# Patient Record
Sex: Female | Born: 1995 | Race: Black or African American | Hispanic: Yes | Marital: Single | Smoking: Never smoker
Health system: Southern US, Community
[De-identification: ages and names within clinical notes are randomized; demographics above are authoritative.]

## PROBLEM LIST (undated history)

## (undated) DIAGNOSIS — D649 Anemia, unspecified: Secondary | ICD-10-CM

## (undated) DIAGNOSIS — Z789 Other specified health status: Secondary | ICD-10-CM

## (undated) HISTORY — DX: Anemia, unspecified: D64.9

## (undated) HISTORY — DX: Other specified health status: Z78.9

---
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
## 2017-03-19 ENCOUNTER — Other Ambulatory Visit: Payer: Self-pay | Admitting: Nurse Practitioner

## 2017-03-19 DIAGNOSIS — N63 Unspecified lump in unspecified breast: Secondary | ICD-10-CM

## 2017-04-01 ENCOUNTER — Other Ambulatory Visit: Payer: Self-pay

## 2017-05-30 ENCOUNTER — Encounter: Payer: Self-pay | Admitting: Emergency Medicine

## 2017-05-30 ENCOUNTER — Other Ambulatory Visit: Payer: Self-pay

## 2017-05-30 ENCOUNTER — Emergency Department
Admission: EM | Admit: 2017-05-30 | Discharge: 2017-05-30 | Disposition: A | Discharge status: Home / Self Care | Payer: Self-pay | Attending: Emergency Medicine | Admitting: Emergency Medicine

## 2017-05-30 DIAGNOSIS — Z5321 Procedure and treatment not carried out due to patient leaving prior to being seen by health care provider: Secondary | ICD-10-CM | POA: Insufficient documentation

## 2017-05-30 DIAGNOSIS — R102 Pelvic and perineal pain: Secondary | ICD-10-CM | POA: Insufficient documentation

## 2017-05-30 NOTE — ED Notes (Signed)
Called for room not in waiting room.

## 2017-05-30 NOTE — ED Notes (Signed)
Called for room, not in waiting room.  

## 2017-05-30 NOTE — ED Triage Notes (Signed)
Patient reports increased vaginal bleeding and pelvic pain since having IUD placed in January.  Patient reports she is unable to locate string now.

## 2017-11-12 LAB — HM HIV SCREENING LAB: HM HIV Screening: NEGATIVE

## 2018-01-19 ENCOUNTER — Encounter: Payer: Self-pay | Admitting: Obstetrics and Gynecology

## 2018-03-09 ENCOUNTER — Encounter: Payer: Self-pay | Admitting: Emergency Medicine

## 2018-03-09 ENCOUNTER — Emergency Department
Admission: EM | Admit: 2018-03-09 | Discharge: 2018-03-10 | Disposition: A | Discharge status: Home / Self Care | Payer: Medicaid Other | Attending: Emergency Medicine | Admitting: Emergency Medicine

## 2018-03-09 ENCOUNTER — Emergency Department: Payer: Medicaid Other

## 2018-03-09 ENCOUNTER — Other Ambulatory Visit: Payer: Self-pay

## 2018-03-09 DIAGNOSIS — O209 Hemorrhage in early pregnancy, unspecified: Secondary | ICD-10-CM | POA: Insufficient documentation

## 2018-03-09 DIAGNOSIS — Z3A00 Weeks of gestation of pregnancy not specified: Secondary | ICD-10-CM | POA: Insufficient documentation

## 2018-03-09 DIAGNOSIS — O4690 Antepartum hemorrhage, unspecified, unspecified trimester: Secondary | ICD-10-CM

## 2018-03-09 DIAGNOSIS — Z79899 Other long term (current) drug therapy: Secondary | ICD-10-CM | POA: Insufficient documentation

## 2018-03-09 DIAGNOSIS — F1210 Cannabis abuse, uncomplicated: Secondary | ICD-10-CM | POA: Diagnosis not present

## 2018-03-09 DIAGNOSIS — Z7689 Persons encountering health services in other specified circumstances: Secondary | ICD-10-CM | POA: Diagnosis not present

## 2018-03-09 DIAGNOSIS — Z0379 Encounter for other suspected maternal and fetal conditions ruled out: Secondary | ICD-10-CM | POA: Diagnosis not present

## 2018-03-09 DIAGNOSIS — Z8759 Personal history of other complications of pregnancy, childbirth and the puerperium: Secondary | ICD-10-CM | POA: Insufficient documentation

## 2018-03-09 DIAGNOSIS — A599 Trichomoniasis, unspecified: Secondary | ICD-10-CM | POA: Insufficient documentation

## 2018-03-09 LAB — WET PREP, GENITAL
Clue Cells Wet Prep HPF POC: NONE SEEN
Sperm: NONE SEEN
Yeast Wet Prep HPF POC: NONE SEEN

## 2018-03-09 LAB — HCG, QUANTITATIVE, PREGNANCY: hCG, Beta Chain, Quant, S: 44440 m[IU]/mL — ABNORMAL HIGH (ref <5)

## 2018-03-09 LAB — CHLAMYDIA/NGC RT PCR (ARMC ONLY)
CHLAMYDIA TR: NOT DETECTED
N gonorrhoeae: NOT DETECTED

## 2018-03-09 MED ORDER — METRONIDAZOLE 500 MG PO TABS: 500.0000 mg | ORAL_TABLET | Freq: Two times a day (BID) | ORAL | 0 refills | Status: AC

## 2018-03-09 NOTE — ED Notes (Signed)
Patient transported to Ultrasound 

## 2018-03-09 NOTE — ED Triage Notes (Signed)
Pt presents to ED via POV with c/o vaginal discharge. Pt states is pregnant, unknown how far along, and has not had prenatal visit yet. Pt states brown discharge, with intermittent small spotting that started yesterday.

## 2018-03-09 NOTE — ED Provider Notes (Signed)
[facility] Emergency Department Provider Note  ____________________________________________   I have reviewed the triage vital signs and the nursing notes.   HISTORY  Chief Complaint Vaginal Bleeding   History limited by: Not Limited   HPI Loretta Bird is a 22 y.o. female who presents to the emergency department today is because of concerns for vaginal spotting in the setting of early pregnancy.  The patient states she found out she was pregnant a couple of weeks ago.  Since then she has noticed increased vaginal discharge.  She denies any bad odor to the discharge.  She states that she today noticed some spotting.  She denies any associated abdominal pain.  She states that she does have history of miscarriage in the past.    Per medical record review patient has a history of miscarraige  History reviewed. No pertinent past medical history.  History reviewed. No pertinent surgical history.  Prior to Admission medications   Medication Sig Start Date End Date Taking? Authorizing Provider  ibuprofen (ADVIL,MOTRIN) 600 MG tablet Take 1 tablet (600 mg total) by mouth every 6 (six) hours as needed. 11/02/16   Minna AntisPaduchowski, Kevin, MD  Prenatal Vit-Fe Fumarate-FA (PRENATAL MULTIVITAMIN) TABS tablet Take 1 tablet by mouth daily at 12 noon.    [provider]    Allergies Patient has no known allergies.  No family history on file.  Social History Social History   Tobacco Use  . Smoking status: Never Smoker  . Smokeless tobacco: Never Used  Substance Use Topics  . Alcohol use: No  . Drug use: Yes    Types: Marijuana    Comment: Last use prior to pregnancy    Review of Systems Constitutional: No fever/chills Eyes: No visual changes. ENT: No sore throat. Cardiovascular: Denies chest pain. Respiratory: Denies shortness of breath. Gastrointestinal: No abdominal pain.  No nausea, no vomiting.  No diarrhea.   Genitourinary: Positive for  vaginal spotting and discharge.  Musculoskeletal: Negative for back pain. Skin: Negative for rash. Neurological: Negative for headaches, focal weakness or numbness.  ____________________________________________   PHYSICAL EXAM:  VITAL SIGNS: ED Triage Vitals  Enc Vitals Group     BP 03/09/18 1736 103/64     Pulse Rate 03/09/18 1736 (!) 103     Resp 03/09/18 1736 18     Temp 03/09/18 1736 98.9 F (37.2 C)     Temp Source 03/09/18 1736 Oral     SpO2 03/09/18 1736 100 %     Weight 03/09/18 1737 130 lb (59 kg)     Height 03/09/18 1737 5\' 3"  (1.6 m)     Head Circumference --      Peak Flow --      Pain Score 03/09/18 1736 1   Constitutional: Alert and oriented.  Eyes: Conjunctivae are normal.  ENT      Head: Normocephalic and atraumatic.      Nose: No congestion/rhinnorhea.      Mouth/Throat: Mucous membranes are moist.      Neck: No stridor. Hematological/Lymphatic/Immunilogical: No cervical lymphadenopathy. Cardiovascular: Normal rate, regular rhythm.  No murmurs, rubs, or gallops. Respiratory: Normal respiratory effort without tachypnea nor retractions. Breath sounds are clear and equal bilaterally. No wheezes/rales/rhonchi. Gastrointestinal: Soft and non tender. No rebound. No guarding.  Genitourinary: Deferred Musculoskeletal: Normal range of motion in all extremities. No lower extremity edema. Neurologic:  Normal speech and language. No gross focal neurologic deficits are appreciated.  Skin:  Skin is warm, dry and intact. No rash noted.  Psychiatric: Mood and affect are normal. Speech and behavior are normal. Patient exhibits appropriate insight and judgment.  ____________________________________________    LABS (pertinent positives/negatives)  Wet prep positive trich hcg [id number]  ____________________________________________   EKG  None  ____________________________________________    RADIOLOGY  [place]  pending  ____________________________________________   PROCEDURES  Procedures  ____________________________________________   INITIAL IMPRESSION / ASSESSMENT AND PLAN / ED COURSE  Pertinent labs & imaging results that were available during my care of the patient were reviewed by me and considered in my medical decision making (see chart for details).   Patient presented to the emergency department today because of concerns for vaginal spotting in setting of early pregnancy.  Patient was also complaining of vaginal discharge so pelvic exam and wet prep were performed.  This was positive for trichomoniasis.  Did discuss this with the patient.  Discussed importance of sexual partners being treated.  Unfortunately bedside ultrasound was not able to identify a heartbeat although I did find an intrauterine pregnancy.  Will send patient for formal ultrasound to better assess. Patient had previous abo performed showing O pos.   ____________________________________________   FINAL CLINICAL IMPRESSION(S) / ED DIAGNOSES  Final diagnoses:  Trichimoniasis  Vaginal bleeding in pregnancy   Note: This dictation was prepared with Dragon dictation. Any transcriptional errors that result from this process are unintentional     Phineas Semen, MD 03/09/18 2252

## 2018-03-10 MED ORDER — METRONIDAZOLE 500 MG PO TABS
ORAL_TABLET | ORAL | Status: AC
  Administered 2018-03-10: 2000 mg via ORAL
  Filled 2018-03-10: qty 4

## 2018-03-10 MED ORDER — METRONIDAZOLE 500 MG PO TABS
2000.0000 mg | ORAL_TABLET | Freq: Once | ORAL | Status: AC
  Administered 2018-03-10: 2000 mg via ORAL

## 2018-03-10 NOTE — ED Notes (Signed)
This Clinical research associatewriter went to check on patient. Patient was not in room. Notified EDP Dr. Manson PasseyBrown. This Clinical research associatewriter checked bathrooms and patient was not in either. ED tech from front walked patient back to ed after patient was trying to leave AMA. EDP spoke to patient with this Clinical research associatewriter and another Charity fundraiserN as witnesses.

## 2018-03-10 NOTE — ED Notes (Signed)
EDP in with patient 

## 2018-03-10 NOTE — ED Provider Notes (Signed)
I assumed care of the patient from Dr. Derrill KayGoodman at 11:30 PM with ultrasound pending.  Ultrasound revealed a single intrauterine pregnancy at 11 weeks 5 days with absent fetal activity consistent with fetal demise per radiologist.  I spoke with the patient regarding ultrasound findings and subsequently spoke with Dr. Logan BoresEvans [facility] regarding ultrasound findings who will see the patient on the outpatient setting.  Patient also noted to have trichomonas for which patient was treated.  Patient was informed to follow-up with Dr. Logan BoresEvans today.   Darci CurrentBrown, [place], MD 03/10/18 [phone]

## 2018-03-11 LAB — RPR: RPR Ser Ql: NONREACTIVE

## 2018-03-11 LAB — HIV ANTIBODY (ROUTINE TESTING W REFLEX): HIV Screen 4th Generation wRfx: NONREACTIVE

## 2018-03-22 ENCOUNTER — Ambulatory Visit (INDEPENDENT_AMBULATORY_CARE_PROVIDER_SITE_OTHER): Payer: Medicaid Other | Admitting: Obstetrics and Gynecology

## 2018-03-22 ENCOUNTER — Encounter: Payer: Medicaid Other | Admitting: Obstetrics and Gynecology

## 2018-03-22 ENCOUNTER — Encounter: Payer: Self-pay | Admitting: Obstetrics and Gynecology

## 2018-03-22 VITALS — BP 94/64 | HR 96 | Ht 63.0 in | Wt 133.3 lb

## 2018-03-22 DIAGNOSIS — A599 Trichomoniasis, unspecified: Secondary | ICD-10-CM

## 2018-03-22 DIAGNOSIS — O021 Missed abortion: Secondary | ICD-10-CM | POA: Diagnosis not present

## 2018-03-22 DIAGNOSIS — Z01818 Encounter for other preprocedural examination: Secondary | ICD-10-CM | POA: Diagnosis not present

## 2018-03-22 MED ORDER — METRONIDAZOLE 500 MG PO TABS: ORAL_TABLET | ORAL | 0 refills | Status: DC

## 2018-03-22 NOTE — H&P (View-Only) (Signed)
PRE-OPERATIVE HISTORY AND PHYSICAL EXAM  PCP:  Patient, No Pcp Per Subjective:   HPI:  Loretta Bird is a 22 y.o. G3P0010.  No LMP recorded (lmp unknown). Patient is pregnant.  She presents today for a pre-op discussion and PE.  She has the following symptoms: Patient seen in the ED and found to have a missed AB.  This is her first follow-up.  She reports a small amount of spotting but no cramping or bleeding.  She has previously had a miscarriage and says it was "terrible".  She does not want to have another miscarriage and would prefer surgery if possible.  Patient took Flagyl for trichomonas but says she threw up the pills within a few minutes.  She reports her partner has been treated.  Review of Systems:   Constitutional: Denied constitutional symptoms, night sweats, recent illness, fatigue, fever, insomnia and weight loss.  Eyes: Denied eye symptoms, eye pain, photophobia, vision change and visual disturbance.  Ears/Nose/Throat/Neck: Denied ear, nose, throat or neck symptoms, hearing loss, nasal discharge, sinus congestion and sore throat.  Cardiovascular: Denied cardiovascular symptoms, arrhythmia, chest pain/pressure, edema, exercise intolerance, orthopnea and palpitations.  Respiratory: Denied pulmonary symptoms, asthma, pleuritic pain, productive sputum, cough, dyspnea and wheezing.  Gastrointestinal: Denied, gastro-esophageal reflux, melena, nausea and vomiting.  Genitourinary: See HPI for additional information.  Musculoskeletal: Denied musculoskeletal symptoms, stiffness, swelling, muscle weakness and myalgia.  Dermatologic: Denied dermatology symptoms, rash and scar.  Neurologic: Denied neurology symptoms, dizziness, headache, neck pain and syncope.  Psychiatric: Denied psychiatric symptoms, anxiety and depression.  Endocrine: Denied endocrine symptoms including hot flashes and night sweats.   OB History  Gravida Para Term Preterm AB Living  3       1    SAB TAB  Ectopic Multiple Live Births               # Outcome Date GA Lbr Len/2nd Weight Sex Delivery Anes PTL Lv  3 Current           2 AB 2017          1 Gravida             No past medical history on file.  No past surgical history on file.    SOCIAL HISTORY: Social History   Tobacco Use  Smoking Status Never Smoker  Smokeless Tobacco Never Used   Social History   Substance and Sexual Activity  Alcohol Use No   Social History   Substance and Sexual Activity  Drug Use Yes  . Types: Marijuana   Comment: Last use prior to pregnancy    No family history on file.  ALLERGIES:  Patient has no known allergies.  MEDS:   Current Outpatient Medications on File Prior to Visit  Medication Sig Dispense Refill  . Prenatal Vit-Fe Fumarate-FA (PRENATAL MULTIVITAMIN) TABS tablet Take 1 tablet by mouth daily at 12 noon.     No current facility-administered medications on file prior to visit.     No orders of the defined types were placed in this encounter.    Physical examination BP 94/64   Pulse 96   Ht 5\' 3"  (1.6 m)   Wt 133 lb 4.8 oz (60.5 kg)   LMP  (LMP Unknown)   BMI 23.61 kg/m   General NAD, Conversant  HEENT Atraumatic; Op clear with mmm.  Normo-cephalic. Pupils reactive. Anicteric sclerae  Thyroid/Neck Smooth without nodularity or enlargement. Normal ROM.  Neck Supple.  Skin No  rashes, lesions or ulceration. Normal palpated skin turgor. No nodularity.  Breasts: No masses or discharge.  Symmetric.  No axillary adenopathy.  Lungs: Clear to auscultation.No rales or wheezes. Normal Respiratory effort, no retractions.  Heart: NSR.  No murmurs or rubs appreciated. No periferal edema  Abdomen: Soft.  Non-tender.  No masses.  No HSM. No hernia  Extremities: Moves all appropriately.  Normal ROM for age. No lymphadenopathy.  Neuro: Oriented to PPT.  Normal mood. Normal affect.     Pelvic:   Vulva: Normal appearance.  No lesions.  Vagina: No lesions or abnormalities noted.   Support: Normal pelvic support.  Urethra No masses tenderness or scarring.  Meatus Normal size without lesions or prolapse.  Cervix: Normal ectropion.  No lesions.  Anus: Normal exam.  No lesions.  Perineum: Normal exam.  No lesions.        Bimanual   Uterus: 10 wks  Non-tender.  Mobile.  AV.  Adnexae: No masses.  Non-tender to palpation.  Cul-de-sac: Negative for abnormality.   Assessment:   G3P0010 Patient Active Problem List   Diagnosis Date Noted  . ERRONEOUS ENCOUNTER--DISREGARD 02/28/2015    1. Preop examination   2. Missed ab    Patient desires surgery rather than expectant management for medical management of miscarriage.  Plan:   Orders: No orders of the defined types were placed in this encounter.    1.  D&E 2.  Will retreat for trichomonas using Flagyl.  Pre-op discussions regarding Risks and Benefits of her scheduled surgery.  D&E The procedure and the risks and benefits of dilation and curettage/evacuation have been explained to the patient.  The specific risks of bleeding, infection, anesthesia, uterine perforation, and damage to bowel or bladder  have been specifically discussed.  I have answered all of her questions and I believe that she has an adequate and informed understanding of this procedure.  I spent 33 minutes involved in the care of this patient of which greater than 50% was spent discussing miscarriage, missed AB, options of expectant management, medical management, and surgical management.  Risks and benefits of each were reviewed.  All questions answered.  Treatment of trichomonas discussed.  Elonda Huskyavid J. Evans, M.D. 03/22/2018 3:17 PM

## 2018-03-22 NOTE — Progress Notes (Signed)
PRE-OPERATIVE HISTORY AND PHYSICAL EXAM  PCP:  Patient, No Pcp Per Subjective:   HPI:  Loretta Bird is a 22 y.o. G3P0010.  No LMP recorded (lmp unknown). Patient is pregnant.  She presents today for a pre-op discussion and PE.  She has the following symptoms: Patient seen in the ED and found to have a missed AB.  This is her first follow-up.  She reports a small amount of spotting but no cramping or bleeding.  She has previously had a miscarriage and says it was "terrible".  She does not want to have another miscarriage and would prefer surgery if possible.  Patient took Flagyl for trichomonas but says she threw up the pills within a few minutes.  She reports her partner has been treated.  Review of Systems:   Constitutional: Denied constitutional symptoms, night sweats, recent illness, fatigue, fever, insomnia and weight loss.  Eyes: Denied eye symptoms, eye pain, photophobia, vision change and visual disturbance.  Ears/Nose/Throat/Neck: Denied ear, nose, throat or neck symptoms, hearing loss, nasal discharge, sinus congestion and sore throat.  Cardiovascular: Denied cardiovascular symptoms, arrhythmia, chest pain/pressure, edema, exercise intolerance, orthopnea and palpitations.  Respiratory: Denied pulmonary symptoms, asthma, pleuritic pain, productive sputum, cough, dyspnea and wheezing.  Gastrointestinal: Denied, gastro-esophageal reflux, melena, nausea and vomiting.  Genitourinary: See HPI for additional information.  Musculoskeletal: Denied musculoskeletal symptoms, stiffness, swelling, muscle weakness and myalgia.  Dermatologic: Denied dermatology symptoms, rash and scar.  Neurologic: Denied neurology symptoms, dizziness, headache, neck pain and syncope.  Psychiatric: Denied psychiatric symptoms, anxiety and depression.  Endocrine: Denied endocrine symptoms including hot flashes and night sweats.   OB History  Gravida Para Term Preterm AB Living  3       1    SAB TAB  Ectopic Multiple Live Births               # Outcome Date GA Lbr Len/2nd Weight Sex Delivery Anes PTL Lv  3 Current           2 AB 2017          1 Gravida             No past medical history on file.  No past surgical history on file.    SOCIAL HISTORY: Social History   Tobacco Use  Smoking Status Never Smoker  Smokeless Tobacco Never Used   Social History   Substance and Sexual Activity  Alcohol Use No   Social History   Substance and Sexual Activity  Drug Use Yes  . Types: Marijuana   Comment: Last use prior to pregnancy    No family history on file.  ALLERGIES:  Patient has no known allergies.  MEDS:   Current Outpatient Medications on File Prior to Visit  Medication Sig Dispense Refill  . Prenatal Vit-Fe Fumarate-FA (PRENATAL MULTIVITAMIN) TABS tablet Take 1 tablet by mouth daily at 12 noon.     No current facility-administered medications on file prior to visit.     No orders of the defined types were placed in this encounter.    Physical examination BP 94/64   Pulse 96   Ht 5\' 3"  (1.6 m)   Wt 133 lb 4.8 oz (60.5 kg)   LMP  (LMP Unknown)   BMI 23.61 kg/m   General NAD, Conversant  HEENT Atraumatic; Op clear with mmm.  Normo-cephalic. Pupils reactive. Anicteric sclerae  Thyroid/Neck Smooth without nodularity or enlargement. Normal ROM.  Neck Supple.  Skin No  rashes, lesions or ulceration. Normal palpated skin turgor. No nodularity.  Breasts: No masses or discharge.  Symmetric.  No axillary adenopathy.  Lungs: Clear to auscultation.No rales or wheezes. Normal Respiratory effort, no retractions.  Heart: NSR.  No murmurs or rubs appreciated. No periferal edema  Abdomen: Soft.  Non-tender.  No masses.  No HSM. No hernia  Extremities: Moves all appropriately.  Normal ROM for age. No lymphadenopathy.  Neuro: Oriented to PPT.  Normal mood. Normal affect.     Pelvic:   Vulva: Normal appearance.  No lesions.  Vagina: No lesions or abnormalities noted.   Support: Normal pelvic support.  Urethra No masses tenderness or scarring.  Meatus Normal size without lesions or prolapse.  Cervix: Normal ectropion.  No lesions.  Anus: Normal exam.  No lesions.  Perineum: Normal exam.  No lesions.        Bimanual   Uterus: 10 wks  Non-tender.  Mobile.  AV.  Adnexae: No masses.  Non-tender to palpation.  Cul-de-sac: Negative for abnormality.   Assessment:   G3P0010 Patient Active Problem List   Diagnosis Date Noted  . ERRONEOUS ENCOUNTER--DISREGARD 02/28/2015    1. Preop examination   2. Missed ab    Patient desires surgery rather than expectant management for medical management of miscarriage.  Plan:   Orders: No orders of the defined types were placed in this encounter.    1.  D&E 2.  Will retreat for trichomonas using Flagyl.  Pre-op discussions regarding Risks and Benefits of her scheduled surgery.  D&E The procedure and the risks and benefits of dilation and curettage/evacuation have been explained to the patient.  The specific risks of bleeding, infection, anesthesia, uterine perforation, and damage to bowel or bladder  have been specifically discussed.  I have answered all of her questions and I believe that she has an adequate and informed understanding of this procedure.  I spent 33 minutes involved in the care of this patient of which greater than 50% was spent discussing miscarriage, missed AB, options of expectant management, medical management, and surgical management.  Risks and benefits of each were reviewed.  All questions answered.  Treatment of trichomonas discussed.  Domenica Weightman J. Glendell Schlottman, M.D. 03/22/2018 3:17 PM   

## 2018-03-22 NOTE — H&P (Signed)
PRE-OPERATIVE HISTORY AND PHYSICAL EXAM  PCP:  Patient, No Pcp Per Subjective:   HPI:  Loretta Bird is a 22 y.o. G3P0010.  No LMP recorded (lmp unknown). Patient is pregnant.  She presents today for a pre-op discussion and PE.  She has the following symptoms: Patient seen in the ED and found to have a missed AB.  This is her first follow-up.  She reports a small amount of spotting but no cramping or bleeding.  She has previously had a miscarriage and says it was "terrible".  She does not want to have another miscarriage and would prefer surgery if possible.  Patient took Flagyl for trichomonas but says she threw up the pills within a few minutes.  She reports her partner has been treated.  Review of Systems:   Constitutional: Denied constitutional symptoms, night sweats, recent illness, fatigue, fever, insomnia and weight loss.  Eyes: Denied eye symptoms, eye pain, photophobia, vision change and visual disturbance.  Ears/Nose/Throat/Neck: Denied ear, nose, throat or neck symptoms, hearing loss, nasal discharge, sinus congestion and sore throat.  Cardiovascular: Denied cardiovascular symptoms, arrhythmia, chest pain/pressure, edema, exercise intolerance, orthopnea and palpitations.  Respiratory: Denied pulmonary symptoms, asthma, pleuritic pain, productive sputum, cough, dyspnea and wheezing.  Gastrointestinal: Denied, gastro-esophageal reflux, melena, nausea and vomiting.  Genitourinary: See HPI for additional information.  Musculoskeletal: Denied musculoskeletal symptoms, stiffness, swelling, muscle weakness and myalgia.  Dermatologic: Denied dermatology symptoms, rash and scar.  Neurologic: Denied neurology symptoms, dizziness, headache, neck pain and syncope.  Psychiatric: Denied psychiatric symptoms, anxiety and depression.  Endocrine: Denied endocrine symptoms including hot flashes and night sweats.   OB History  Gravida Para Term Preterm AB Living  3       1    SAB TAB  Ectopic Multiple Live Births               # Outcome Date GA Lbr Len/2nd Weight Sex Delivery Anes PTL Lv  3 Current           2 AB 2017          1 Gravida             No past medical history on file.  No past surgical history on file.    SOCIAL HISTORY: Social History   Tobacco Use  Smoking Status Never Smoker  Smokeless Tobacco Never Used   Social History   Substance and Sexual Activity  Alcohol Use No   Social History   Substance and Sexual Activity  Drug Use Yes  . Types: Marijuana   Comment: Last use prior to pregnancy    No family history on file.  ALLERGIES:  Patient has no known allergies.  MEDS:   Current Outpatient Medications on File Prior to Visit  Medication Sig Dispense Refill  . Prenatal Vit-Fe Fumarate-FA (PRENATAL MULTIVITAMIN) TABS tablet Take 1 tablet by mouth daily at 12 noon.     No current facility-administered medications on file prior to visit.     No orders of the defined types were placed in this encounter.    Physical examination BP 94/64   Pulse 96   Ht 5\' 3"  (1.6 m)   Wt 133 lb 4.8 oz (60.5 kg)   LMP  (LMP Unknown)   BMI 23.61 kg/m   General NAD, Conversant  HEENT Atraumatic; Op clear with mmm.  Normo-cephalic. Pupils reactive. Anicteric sclerae  Thyroid/Neck Smooth without nodularity or enlargement. Normal ROM.  Neck Supple.  Skin No  rashes, lesions or ulceration. Normal palpated skin turgor. No nodularity.  Breasts: No masses or discharge.  Symmetric.  No axillary adenopathy.  Lungs: Clear to auscultation.No rales or wheezes. Normal Respiratory effort, no retractions.  Heart: NSR.  No murmurs or rubs appreciated. No periferal edema  Abdomen: Soft.  Non-tender.  No masses.  No HSM. No hernia  Extremities: Moves all appropriately.  Normal ROM for age. No lymphadenopathy.  Neuro: Oriented to PPT.  Normal mood. Normal affect.     Pelvic:   Vulva: Normal appearance.  No lesions.  Vagina: No lesions or abnormalities noted.   Support: Normal pelvic support.  Urethra No masses tenderness or scarring.  Meatus Normal size without lesions or prolapse.  Cervix: Normal ectropion.  No lesions.  Anus: Normal exam.  No lesions.  Perineum: Normal exam.  No lesions.        Bimanual   Uterus: 10 wks  Non-tender.  Mobile.  AV.  Adnexae: No masses.  Non-tender to palpation.  Cul-de-sac: Negative for abnormality.   Assessment:   G3P0010 Patient Active Problem List   Diagnosis Date Noted  . ERRONEOUS ENCOUNTER--DISREGARD 02/28/2015    1. Preop examination   2. Missed ab    Patient desires surgery rather than expectant management for medical management of miscarriage.  Plan:   Orders: No orders of the defined types were placed in this encounter.    1.  D&E 2.  Will retreat for trichomonas using Flagyl.  Pre-op discussions regarding Risks and Benefits of her scheduled surgery.  D&E The procedure and the risks and benefits of dilation and curettage/evacuation have been explained to the patient.  The specific risks of bleeding, infection, anesthesia, uterine perforation, and damage to bowel or bladder  have been specifically discussed.  I have answered all of her questions and I believe that she has an adequate and informed understanding of this procedure.  I spent 33 minutes involved in the care of this patient of which greater than 50% was spent discussing miscarriage, missed AB, options of expectant management, medical management, and surgical management.  Risks and benefits of each were reviewed.  All questions answered.  Treatment of trichomonas discussed.   J. , M.D. 03/22/2018 3:17 PM   

## 2018-03-26 ENCOUNTER — Ambulatory Visit: Payer: Medicaid Other | Admitting: Anesthesiology

## 2018-03-26 ENCOUNTER — Ambulatory Visit
Admission: RE | Admit: 2018-03-26 | Discharge: 2018-03-26 | Disposition: A | Discharge status: Home / Self Care | Payer: Medicaid Other | Attending: Obstetrics and Gynecology | Admitting: Obstetrics and Gynecology

## 2018-03-26 ENCOUNTER — Encounter
Admission: RE | Disposition: A | Discharge status: Home / Self Care | Payer: Self-pay | Source: Home / Self Care | Attending: Obstetrics and Gynecology

## 2018-03-26 ENCOUNTER — Other Ambulatory Visit: Payer: Self-pay

## 2018-03-26 DIAGNOSIS — O021 Missed abortion: Secondary | ICD-10-CM | POA: Insufficient documentation

## 2018-03-26 DIAGNOSIS — Z3A10 10 weeks gestation of pregnancy: Secondary | ICD-10-CM

## 2018-03-26 HISTORY — PX: DILATION AND EVACUATION: SHX1459

## 2018-03-26 LAB — CBC
HCT: 33.1 % — ABNORMAL LOW (ref 36.0–46.0)
Hemoglobin: 10.8 g/dL — ABNORMAL LOW (ref 12.0–15.0)
MCH: 28.1 pg (ref 26.0–34.0)
MCHC: 32.6 g/dL (ref 30.0–36.0)
MCV: 86 fL (ref 80.0–100.0)
Platelets: 288 10*3/uL (ref 150–400)
RBC: 3.85 MIL/uL — ABNORMAL LOW (ref 3.87–5.11)
RDW: 13.9 % (ref 11.5–15.5)
WBC: 12.8 10*3/uL — ABNORMAL HIGH (ref 4.0–10.5)
nRBC: 0 % (ref 0.0–0.2)

## 2018-03-26 LAB — URINE DRUG SCREEN, QUALITATIVE (ARMC ONLY)
Amphetamines, Ur Screen: NOT DETECTED
Barbiturates, Ur Screen: NOT DETECTED
Benzodiazepine, Ur Scrn: NOT DETECTED
Cannabinoid 50 Ng, Ur ~~LOC~~: POSITIVE — AB
Cocaine Metabolite,Ur ~~LOC~~: NOT DETECTED
MDMA (Ecstasy)Ur Screen: NOT DETECTED
Methadone Scn, Ur: NOT DETECTED
Opiate, Ur Screen: NOT DETECTED
Phencyclidine (PCP) Ur S: NOT DETECTED
Tricyclic, Ur Screen: NOT DETECTED

## 2018-03-26 LAB — TYPE AND SCREEN
ABO/RH(D): O POS
Antibody Screen: NEGATIVE

## 2018-03-26 LAB — HCG, QUANTITATIVE, PREGNANCY: hCG, Beta Chain, Quant, S: 1256 m[IU]/mL — ABNORMAL HIGH (ref <5)

## 2018-03-26 SURGERY — DILATION AND EVACUATION, UTERUS
Anesthesia: General

## 2018-03-26 MED ORDER — ONDANSETRON HCL 4 MG/2ML IJ SOLN
INTRAMUSCULAR | Status: DC | PRN
  Administered 2018-03-26: 4 mg via INTRAVENOUS

## 2018-03-26 MED ORDER — DEXTROSE IN LACTATED RINGERS 5 % IV SOLN: INTRAVENOUS | Status: DC

## 2018-03-26 MED ORDER — PHENYLEPHRINE HCL 10 MG/ML IJ SOLN
INTRAMUSCULAR | Status: DC | PRN
  Administered 2018-03-26: 100 ug via INTRAVENOUS

## 2018-03-26 MED ORDER — HYDROMORPHONE HCL 1 MG/ML IJ SOLN: 0.2500 mg | INTRAMUSCULAR | Status: DC | PRN

## 2018-03-26 MED ORDER — DEXAMETHASONE SODIUM PHOSPHATE 10 MG/ML IJ SOLN
INTRAMUSCULAR | Status: DC | PRN
  Administered 2018-03-26: 5 mg via INTRAVENOUS

## 2018-03-26 MED ORDER — OXYTOCIN 10 UNIT/ML IJ SOLN
INTRAMUSCULAR | Status: AC
  Filled 2018-03-26: qty 2

## 2018-03-26 MED ORDER — SEVOFLURANE IN SOLN
RESPIRATORY_TRACT | Status: AC
  Filled 2018-03-26: qty 250

## 2018-03-26 MED ORDER — ACETAMINOPHEN NICU IV SYRINGE 10 MG/ML
INTRAVENOUS | Status: AC
  Filled 2018-03-26: qty 1

## 2018-03-26 MED ORDER — ONDANSETRON HCL 4 MG/2ML IJ SOLN: 4.0000 mg | Freq: Four times a day (QID) | INTRAMUSCULAR | Status: DC | PRN

## 2018-03-26 MED ORDER — ONDANSETRON 4 MG PO TBDP: 4.0000 mg | ORAL_TABLET | Freq: Four times a day (QID) | ORAL | Status: DC | PRN

## 2018-03-26 MED ORDER — KETOROLAC TROMETHAMINE 30 MG/ML IJ SOLN
30.0000 mg | Freq: Once | INTRAMUSCULAR | Status: AC
  Administered 2018-03-26: 30 mg via INTRAVENOUS
  Filled 2018-03-26: qty 1

## 2018-03-26 MED ORDER — OXYTOCIN 10 UNIT/ML IJ SOLN
INTRAVENOUS | Status: DC | PRN
  Administered 2018-03-26: 20 [IU] via INTRAVENOUS

## 2018-03-26 MED ORDER — FENTANYL CITRATE (PF) 100 MCG/2ML IJ SOLN
INTRAMUSCULAR | Status: AC
  Filled 2018-03-26: qty 2

## 2018-03-26 MED ORDER — KETOROLAC TROMETHAMINE 30 MG/ML IJ SOLN
INTRAMUSCULAR | Status: AC
  Administered 2018-03-26: 30 mg via INTRAVENOUS
  Filled 2018-03-26: qty 1

## 2018-03-26 MED ORDER — PROPOFOL 10 MG/ML IV BOLUS
INTRAVENOUS | Status: DC | PRN
  Administered 2018-03-26: 40 mg via INTRAVENOUS
  Administered 2018-03-26: 160 mg via INTRAVENOUS

## 2018-03-26 MED ORDER — MIDAZOLAM HCL 2 MG/2ML IJ SOLN
INTRAMUSCULAR | Status: AC
  Filled 2018-03-26: qty 2

## 2018-03-26 MED ORDER — FENTANYL CITRATE (PF) 100 MCG/2ML IJ SOLN
INTRAMUSCULAR | Status: DC | PRN
  Administered 2018-03-26 (×2): 50 ug via INTRAVENOUS

## 2018-03-26 MED ORDER — LIDOCAINE HCL (CARDIAC) PF 100 MG/5ML IV SOSY
PREFILLED_SYRINGE | INTRAVENOUS | Status: DC | PRN
  Administered 2018-03-26: 40 mg via INTRAVENOUS

## 2018-03-26 MED ORDER — LACTATED RINGERS IV SOLN
INTRAVENOUS | Status: DC
  Administered 2018-03-26: 12:00:00 via INTRAVENOUS

## 2018-03-26 SURGICAL SUPPLY — 26 items
ADAPTER VACURETTE TBG SET 14 (CANNULA) IMPLANT
CATH ROBINSON RED A/P 16FR (CATHETERS) ×2 IMPLANT
COVER WAND RF STERILE (DRAPES) ×2 IMPLANT
CUP MEDICINE 2OZ PLAST GRAD ST (MISCELLANEOUS) ×2 IMPLANT
DRSG TELFA 3X8 NADH (GAUZE/BANDAGES/DRESSINGS) ×2 IMPLANT
FILTER UTR ASPR SPEC (MISCELLANEOUS) ×1 IMPLANT
FLTR UTR ASPR SPEC (MISCELLANEOUS) ×2
GAUZE 4X4 16PLY RFD (DISPOSABLE) ×2 IMPLANT
GLOVE BIOGEL PI ORTHO PRO 7.5 (GLOVE) ×1
GLOVE PI ORTHO PRO STRL 7.5 (GLOVE) ×1 IMPLANT
GOWN STRL REUS W/ TWL LRG LVL3 (GOWN DISPOSABLE) ×2 IMPLANT
GOWN STRL REUS W/TWL LRG LVL3 (GOWN DISPOSABLE) ×2
KIT BERKELEY 1ST TRIMESTER 3/8 (MISCELLANEOUS) ×2 IMPLANT
KIT TURNOVER KIT A (KITS) ×2 IMPLANT
NEEDLE HYPO 25X1 1.5 SAFETY (NEEDLE) ×2 IMPLANT
PACK DNC HYST (MISCELLANEOUS) ×2 IMPLANT
PAD OB MATERNITY 4.3X12.25 (PERSONAL CARE ITEMS) ×2 IMPLANT
PAD PREP 24X41 OB/GYN DISP (PERSONAL CARE ITEMS) ×2 IMPLANT
SET BERKELEY SUCTION TUBING (SUCTIONS) ×2 IMPLANT
SOL PREP PVP 2OZ (MISCELLANEOUS) ×2
SOLUTION PREP PVP 2OZ (MISCELLANEOUS) ×1 IMPLANT
VACURETTE 10 RIGID CVD (CANNULA) ×2 IMPLANT
VACURETTE 12 RIGID CVD (CANNULA) IMPLANT
VACURETTE 7MM F TIP (CANNULA) ×1
VACURETTE 7MM F TIP STRL (CANNULA) ×1 IMPLANT
VACURETTE 8 RIGID CVD (CANNULA) IMPLANT

## 2018-03-26 NOTE — Op Note (Signed)
    OPERATIVE NOTE 03/26/2018 12:42 PM  PRE-OPERATIVE DIAGNOSIS:  1) MISSED AB  POST-OPERATIVE DIAGNOSIS:  Same  OPERATION:  D&E  SURGEON(S): Surgeon(s) and Role:    Linzie Collin* Jassen Sarver James, MD - Primary   ANESTHESIA: General  ESTIMATED BLOOD LOSS: 100 mL  OPERATIVE FINDINGS:   SPECIMEN:  ID Type Source Tests Collected by Time Destination  1 : Products of conception  [facility] Linzie CollinEvans, Miguel Christiana James, MD 03/26/2018 1237     COMPLICATIONS: None  DRAINS: Foley to gravity  DISPOSITION: Stable to recovery room  DESCRIPTION OF PROCEDURE:      The patient was prepped and draped in the dorsal lithotomy position and placed under general anesthesia. Her cervix was grasped with a Jacob's tenaculum. Respecting the position and curvature of her cervix, it was dilated to accommodate a number 10 suction curette. The suction curette was placed within the endometrial cavity and a pressure greater than 65 mmHg was allowed to build. A systematic curettage was performed in all quadrants until no additional tissue was noted. The uterus became firm and globular. Pitocin was run in the IV. The tenaculum was removed from the cervix and hemostasis was noted. The weighted speculum was removed and the patient went to recovery room in stable condition.  No follow-up provider specified.  Elonda Huskyavid J. Ziah Turvey, M.D. 03/26/2018 12:42 PM

## 2018-03-26 NOTE — Discharge Instructions (Addendum)
Dilation and Curettage or Vacuum Curettage, Care After °These instructions give you information about caring for yourself after your procedure. Your doctor may also give you more specific instructions. Call your doctor if you have any problems or questions after your procedure. °Follow these instructions at home: °Activity °· Do not drive or use heavy machinery while taking prescription pain medicine. °· For 24 hours after your procedure, avoid driving. °· Take short walks often, followed by rest periods. Ask your doctor what activities are safe for you. After one or two days, you may be able to return to your normal activities. °· Do not lift anything that is heavier than 10 lb (4.5 kg) until your doctor approves. °· For at least 2 weeks, or as long as told by your doctor: °? Do not douche. °? Do not use tampons. °? Do not have sex. °General instructions ° °· Take over-the-counter and prescription medicines only as told by your doctor. This is very important if you take blood thinning medicine. °· Do not take baths, swim, or use a hot tub until your doctor approves. Take showers instead of baths. °· Wear compression stockings as told by your doctor. °· It is up to you to get the results of your procedure. Ask your doctor when your results will be ready. °· Keep all follow-up visits as told by your doctor. This is important. °Contact a doctor if: °· You have very bad cramps that get worse or do not get better with medicine. °· You have very bad pain in your belly (abdomen). °· You cannot drink fluids without throwing up (vomiting). °· You get pain in a different part of the area between your belly and thighs (pelvis). °· You have bad-smelling discharge from your vagina. °· You have a rash. °Get help right away if: °· You are bleeding a lot from your vagina. A lot of bleeding means soaking more than one sanitary pad in an hour, for 2 hours in a row. °· You have clumps of blood (blood clots) coming from your  vagina. °· You have a fever or chills. °· Your belly feels very tender or hard. °· You have chest pain. °· You have trouble breathing. °· You cough up blood. °· You feel dizzy. °· You feel light-headed. °· You pass out (faint). °· You have pain in your neck or shoulder area. °Summary °· Take short walks often, followed by rest periods. Ask your doctor what activities are safe for you. After one or two days, you may be able to return to your normal activities. °· Do not lift anything that is heavier than 10 lb (4.5 kg) until your doctor approves. °· Do not take baths, swim, or use a hot tub until your doctor approves. Take showers instead of baths. °· Contact your doctor if you have any symptoms of infection, like bad-smelling discharge from your vagina. °This information is not intended to replace advice given to you by your health care provider. Make sure you discuss any questions you have with your health care provider. °Document Released: 12/18/2007 Document Revised: 11/26/2015 Document Reviewed: 11/26/2015 °Elsevier Interactive Patient Education © 2019 [employer] ° °AMBULATORY SURGERY  °DISCHARGE INSTRUCTIONS ° ° °1) The drugs that you were given will stay in your system until tomorrow so for the next 24 hours you should not: ° °A) Drive an automobile °B) Make any legal decisions °C) Drink any alcoholic beverage ° ° °2) You may resume regular meals tomorrow.  Today it is better   to start with liquids and gradually work up to solid foods. ° °You may eat anything you prefer, but it is better to start with liquids, then soup and crackers, and gradually work up to solid foods. ° ° °3) Please notify your doctor immediately if you have any unusual bleeding, trouble breathing, redness and pain at the surgery site, drainage, fever, or pain not relieved by medication. ° ° ° °4) Additional Instructions: ° ° ° ° ° ° ° °Please contact your physician with any problems or Same Day Surgery at [phone], Monday through  Friday 6 am to 4 pm, or [place] [facility] Main number at [phone]. ° °

## 2018-03-26 NOTE — Anesthesia Procedure Notes (Signed)
Procedure Name: LMA Insertion Date/Time: 03/26/2018 12:12 PM Performed by: Edmund Hilda, CRNA Pre-anesthesia Checklist: Patient identified, Patient being monitored, Timeout performed, Emergency Drugs available and Suction available Patient Re-evaluated:Patient Re-evaluated prior to induction Oxygen Delivery Method: Circle system utilized Preoxygenation: Pre-oxygenation with 100% oxygen Induction Type: IV induction Ventilation: Mask ventilation without difficulty LMA: LMA inserted LMA Size: 4.0 Tube type: Oral Number of attempts: 1 Placement Confirmation: positive ETCO2 and breath sounds checked- equal and bilateral Tube secured with: Tape Dental Injury: Teeth and Oropharynx as per pre-operative assessment

## 2018-03-26 NOTE — Anesthesia Preprocedure Evaluation (Addendum)
Anesthesia Evaluation  Patient identified by MRN, date of birth, ID band Patient awake    Reviewed: Allergy & Precautions, H&P , NPO status , Patient's Chart, lab work & pertinent test results  History of Anesthesia Complications Negative for: history of anesthetic complications  Airway Mallampati: II  TM Distance: >3 FB     Dental  (+) Teeth Intact   Pulmonary neg pulmonary ROS,           Cardiovascular negative cardio ROS       Neuro/Psych negative neurological ROS  negative psych ROS   GI/Hepatic negative GI ROS, Neg liver ROS,   Endo/Other  negative endocrine ROS  Renal/GU      Musculoskeletal   Abdominal   Peds  Hematology negative hematology ROS (+)   Anesthesia Other Findings No past medical history on file.  No past surgical history on file.     Reproductive/Obstetrics Missed AB at approx [redacted] wks GA                           Anesthesia Physical Anesthesia Plan  ASA: II  Anesthesia Plan: General LMA   Post-op Pain Management:    Induction:   PONV Risk Score and Plan: Dexamethasone, Ondansetron, Midazolam and Treatment may vary due to age or medical condition  Airway Management Planned:   Additional Equipment:   Intra-op Plan:   Post-operative Plan:   Informed Consent: I have reviewed the patients History and Physical, chart, labs and discussed the procedure including the risks, benefits and alternatives for the proposed anesthesia with the patient or authorized representative who has indicated his/her understanding and acceptance.   Dental Advisory Given  Plan Discussed with: Anesthesiologist, CRNA and Surgeon  Anesthesia Plan Comments:       Anesthesia Quick Evaluation

## 2018-03-26 NOTE — OR Nursing (Signed)
Discussed discharge instructions with pt. Pt did not want [place] to discuss discharge instructions with anyone else.

## 2018-03-26 NOTE — Transfer of Care (Signed)
Immediate Anesthesia Transfer of Care Note  Patient: Loretta Bird  Procedure(s) Performed: DILATATION AND EVACUATION (N/A )  Patient Location: PACU  Anesthesia Type:General  Level of Consciousness: awake and drowsy  Airway & Oxygen Therapy: Patient Spontanous Breathing and Patient connected to face mask oxygen  Post-op Assessment: Report given to RN, Post -op Vital signs reviewed and stable and Patient moving all extremities  Post vital signs: Reviewed and stable  Last Vitals:  Vitals Value Taken Time  BP 113/70 03/26/2018 12:55 PM  Temp 36.4 C 03/26/2018 12:55 PM  Pulse 99 03/26/2018 12:59 PM  Resp 15 03/26/2018 12:59 PM  SpO2 100 % 03/26/2018 12:59 PM  Vitals shown include unvalidated device data.  Last Pain:  Vitals:   03/26/18 1123  TempSrc: Tympanic  PainSc: 2          Complications: No apparent anesthesia complications

## 2018-03-26 NOTE — Anesthesia Post-op Follow-up Note (Signed)
Anesthesia QCDR form completed.        

## 2018-03-26 NOTE — Anesthesia Postprocedure Evaluation (Signed)
Anesthesia Post Note  Patient: Loretta Bird  Procedure(s) Performed: DILATATION AND EVACUATION (N/A )  Patient location during evaluation: PACU Anesthesia Type: General Level of consciousness: awake and alert Pain management: pain level controlled Vital Signs Assessment: post-procedure vital signs reviewed and stable Respiratory status: spontaneous breathing, nonlabored ventilation, respiratory function stable and patient connected to nasal cannula oxygen Cardiovascular status: blood pressure returned to baseline and stable Postop Assessment: no apparent nausea or vomiting Anesthetic complications: no     Last Vitals:  Vitals:   03/26/18 1355 03/26/18 1404  BP: (!) 104/59 103/69  Pulse: 64 72  Resp: 20 18  Temp:  36.6 C  SpO2: 100% 100%    Last Pain:  Vitals:   03/26/18 1404  TempSrc:   PainSc: 0-No pain                 Jovita Gamma

## 2018-03-26 NOTE — Interval H&P Note (Signed)
History and Physical Interval Note:  03/26/2018 12:08 PM  Loretta Bird  has presented today for surgery, with the diagnosis of MISSED AB  The various methods of treatment have been discussed with the patient and family. After consideration of risks, benefits and other options for treatment, the patient has consented to  Procedure(s): DILATATION AND EVACUATION (N/A) as a surgical intervention .  The patient's history has been reviewed, patient examined, no change in status, stable for surgery.  I have reviewed the patient's chart and labs.  Questions were answered to the patient's satisfaction.     Brennan Bailey

## 2018-03-28 ENCOUNTER — Encounter: Payer: Self-pay | Admitting: Obstetrics and Gynecology

## 2018-03-29 LAB — SURGICAL PATHOLOGY

## 2018-11-22 ENCOUNTER — Other Ambulatory Visit: Payer: Self-pay

## 2018-11-22 ENCOUNTER — Encounter: Payer: Self-pay | Admitting: Family Medicine

## 2018-11-22 ENCOUNTER — Ambulatory Visit: Payer: Medicaid Other | Admitting: Family Medicine

## 2018-11-22 DIAGNOSIS — N926 Irregular menstruation, unspecified: Secondary | ICD-10-CM | POA: Diagnosis not present

## 2018-11-22 DIAGNOSIS — N898 Other specified noninflammatory disorders of vagina: Secondary | ICD-10-CM

## 2018-11-22 DIAGNOSIS — Z1388 Encounter for screening for disorder due to exposure to contaminants: Secondary | ICD-10-CM | POA: Diagnosis not present

## 2018-11-22 DIAGNOSIS — Z113 Encounter for screening for infections with a predominantly sexual mode of transmission: Secondary | ICD-10-CM

## 2018-11-22 DIAGNOSIS — Z3009 Encounter for other general counseling and advice on contraception: Secondary | ICD-10-CM | POA: Diagnosis not present

## 2018-11-22 DIAGNOSIS — Z0389 Encounter for observation for other suspected diseases and conditions ruled out: Secondary | ICD-10-CM | POA: Diagnosis not present

## 2018-11-22 LAB — PREGNANCY, URINE: Preg Test, Ur: NEGATIVE

## 2018-11-22 LAB — WET PREP FOR TRICH, YEAST, CLUE
Trichomonas Exam: NEGATIVE
Yeast Exam: NEGATIVE

## 2018-11-22 NOTE — Progress Notes (Signed)
STI clinic/screening visit  Subjective:  Loretta Bird is a 23 y.o. female being seen today for an STI screening visit. The patient reports they do have symptoms.  Patient has the following medical conditions:     Chief Complaint  Patient presents with  . SEXUALLY TRANSMITTED DISEASE    STD testing    Reports vaginal discharge for 1 week. No associated odor, itching, rash, irritation, pain or bleeding. Reports having the past but not concerned. Reports no known exposures.   Significant other was recently killed- would like LCSW card.    Patient reports vaginal discharge, similar to when she was pregnant  See flowsheet for further details and programmatic requirements.    The following portions of the patient's history were reviewed and updated as appropriate: allergies, current medications, past medical history, past social history, past surgical history and problem list.  Objective:  There were no vitals filed for this visit.  Physical Exam Vitals signs and nursing note reviewed.  Constitutional:      Appearance: Normal appearance.  HENT:     Head: Normocephalic and atraumatic.     Mouth/Throat:     Mouth: Mucous membranes are moist.     Pharynx: Oropharynx is clear. No oropharyngeal exudate or posterior oropharyngeal erythema.  Pulmonary:     Effort: Pulmonary effort is normal.  Abdominal:     General: Abdomen is flat.     Palpations: There is no mass.     Tenderness: There is no abdominal tenderness. There is no rebound.  Genitourinary:    General: Normal vulva.     Exam position: Lithotomy position.     Pubic Area: No rash or pubic lice.      Labia:        Right: No rash or lesion.        Left: No rash or lesion.      Vagina: Normal. No vaginal discharge, erythema, bleeding or lesions.     Cervix: No cervical motion tenderness, discharge (ph <4.5), friability, lesion or erythema.     Uterus: Normal.      Adnexa: Right adnexa normal and left adnexa normal.      Rectum: Normal.  Lymphadenopathy:     Head:     Right side of head: No preauricular or posterior auricular adenopathy.     Left side of head: No preauricular or posterior auricular adenopathy.     Cervical: No cervical adenopathy.     Upper Body:     Right upper body: No supraclavicular or axillary adenopathy.     Left upper body: No supraclavicular or axillary adenopathy.     Lower Body: No right inguinal adenopathy. No left inguinal adenopathy.  Skin:    General: Skin is warm and dry.     Findings: No rash.  Neurological:     Mental Status: She is alert and oriented to person, place, and time.       Assessment and Plan:  Loretta Bird is a 23 y.o. female presenting to the [facility] Department for STI screening  1. Screening examination for venereal disease Accepts vaginal screenings only today, declines blood work despitecounseling. Declined Hep B and C screening. Patient will resign STD general consent to reflect not wanting blood work.  - Treat wet mount per standing - Please give LCSW card, patient does not desire formal referral at this point.  - recommended patient return for pap, never had pap before.  - WET PREP FOR [place], [place], [place]  [place] [facility] - Chlamydia/Gonorrhea [place] [facility]  2. Irregular periods - Pregnancy, urine  3. Vaginal discharge Normal appearing vaginal discharg - Chlamydia/Gonorrhea [place] [facility] - Chlamydia/Gonorrhea  Lab     Return if symptoms worsen or fail to improve.  No future appointments.  Federico FlakeKimberly Niles Newton, MD

## 2018-11-22 NOTE — Progress Notes (Signed)
Wet mount reviewed, no tx per standing order. Pt accepted LCSW business card. Provider orders completed. 

## 2018-12-02 ENCOUNTER — Telehealth: Payer: Self-pay

## 2018-12-02 NOTE — Telephone Encounter (Signed)
TC to patient. Verified ID via password/SS#. Informed of positive chlamydia and need for tx. Instructed to eat before visit and have partner call for tx appt. Appt scheduled.[place] Sarinana, RN    

## 2018-12-03 ENCOUNTER — Other Ambulatory Visit: Payer: Self-pay

## 2018-12-08 ENCOUNTER — Ambulatory Visit: Payer: Medicaid Other

## 2018-12-08 ENCOUNTER — Other Ambulatory Visit: Payer: Self-pay

## 2018-12-08 DIAGNOSIS — Z113 Encounter for screening for infections with a predominantly sexual mode of transmission: Secondary | ICD-10-CM

## 2018-12-08 DIAGNOSIS — A5602 Chlamydial vulvovaginitis: Secondary | ICD-10-CM | POA: Diagnosis not present

## 2018-12-08 MED ORDER — AZITHROMYCIN 500 MG PO TABS
500.0000 mg | ORAL_TABLET | Freq: Once | ORAL | Status: AC
  Administered 2018-12-08: 500 mg via ORAL
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
## 2019-03-03 DIAGNOSIS — Z5181 Encounter for therapeutic drug level monitoring: Secondary | ICD-10-CM | POA: Diagnosis not present

## 2019-03-09 DIAGNOSIS — Z5181 Encounter for therapeutic drug level monitoring: Secondary | ICD-10-CM | POA: Diagnosis not present

## 2019-03-29 DIAGNOSIS — Z5181 Encounter for therapeutic drug level monitoring: Secondary | ICD-10-CM | POA: Diagnosis not present

## 2019-04-05 DIAGNOSIS — Z5181 Encounter for therapeutic drug level monitoring: Secondary | ICD-10-CM | POA: Diagnosis not present

## 2019-04-12 DIAGNOSIS — Z5181 Encounter for therapeutic drug level monitoring: Secondary | ICD-10-CM | POA: Diagnosis not present

## 2019-04-19 DIAGNOSIS — Z5181 Encounter for therapeutic drug level monitoring: Secondary | ICD-10-CM | POA: Diagnosis not present

## 2019-04-26 DIAGNOSIS — Z5181 Encounter for therapeutic drug level monitoring: Secondary | ICD-10-CM | POA: Diagnosis not present

## 2019-05-03 DIAGNOSIS — Z5181 Encounter for therapeutic drug level monitoring: Secondary | ICD-10-CM | POA: Diagnosis not present

## 2019-05-04 DIAGNOSIS — Z20828 Contact with and (suspected) exposure to other viral communicable diseases: Secondary | ICD-10-CM | POA: Diagnosis not present

## 2019-05-10 DIAGNOSIS — Z5181 Encounter for therapeutic drug level monitoring: Secondary | ICD-10-CM | POA: Diagnosis not present

## 2019-05-18 ENCOUNTER — Ambulatory Visit: Payer: Medicaid Other

## 2019-05-19 DIAGNOSIS — Z5181 Encounter for therapeutic drug level monitoring: Secondary | ICD-10-CM | POA: Diagnosis not present

## 2019-05-30 DIAGNOSIS — Z20828 Contact with and (suspected) exposure to other viral communicable diseases: Secondary | ICD-10-CM | POA: Diagnosis not present

## 2019-05-31 DIAGNOSIS — Z5181 Encounter for therapeutic drug level monitoring: Secondary | ICD-10-CM | POA: Diagnosis not present

## 2019-06-01 DIAGNOSIS — Z20828 Contact with and (suspected) exposure to other viral communicable diseases: Secondary | ICD-10-CM | POA: Diagnosis not present

## 2019-06-07 DIAGNOSIS — Z20828 Contact with and (suspected) exposure to other viral communicable diseases: Secondary | ICD-10-CM | POA: Diagnosis not present

## 2019-06-08 DIAGNOSIS — Z5181 Encounter for therapeutic drug level monitoring: Secondary | ICD-10-CM | POA: Diagnosis not present

## 2019-06-09 DIAGNOSIS — Z20828 Contact with and (suspected) exposure to other viral communicable diseases: Secondary | ICD-10-CM | POA: Diagnosis not present

## 2019-06-14 DIAGNOSIS — Z20828 Contact with and (suspected) exposure to other viral communicable diseases: Secondary | ICD-10-CM | POA: Diagnosis not present

## 2019-06-16 DIAGNOSIS — Z20828 Contact with and (suspected) exposure to other viral communicable diseases: Secondary | ICD-10-CM | POA: Diagnosis not present

## 2019-06-21 DIAGNOSIS — Z20828 Contact with and (suspected) exposure to other viral communicable diseases: Secondary | ICD-10-CM | POA: Diagnosis not present

## 2019-06-22 DIAGNOSIS — Z5181 Encounter for therapeutic drug level monitoring: Secondary | ICD-10-CM | POA: Diagnosis not present

## 2019-06-23 DIAGNOSIS — Z20828 Contact with and (suspected) exposure to other viral communicable diseases: Secondary | ICD-10-CM | POA: Diagnosis not present

## 2019-06-28 DIAGNOSIS — Z20828 Contact with and (suspected) exposure to other viral communicable diseases: Secondary | ICD-10-CM | POA: Diagnosis not present

## 2019-06-29 DIAGNOSIS — Z5181 Encounter for therapeutic drug level monitoring: Secondary | ICD-10-CM | POA: Diagnosis not present

## 2019-06-30 DIAGNOSIS — Z20828 Contact with and (suspected) exposure to other viral communicable diseases: Secondary | ICD-10-CM | POA: Diagnosis not present

## 2019-07-05 DIAGNOSIS — Z5181 Encounter for therapeutic drug level monitoring: Secondary | ICD-10-CM | POA: Diagnosis not present

## 2019-07-05 DIAGNOSIS — Z20828 Contact with and (suspected) exposure to other viral communicable diseases: Secondary | ICD-10-CM | POA: Diagnosis not present

## 2019-07-07 DIAGNOSIS — Z20828 Contact with and (suspected) exposure to other viral communicable diseases: Secondary | ICD-10-CM | POA: Diagnosis not present

## 2019-07-11 DIAGNOSIS — Z20828 Contact with and (suspected) exposure to other viral communicable diseases: Secondary | ICD-10-CM | POA: Diagnosis not present

## 2019-07-12 DIAGNOSIS — Z5181 Encounter for therapeutic drug level monitoring: Secondary | ICD-10-CM | POA: Diagnosis not present

## 2019-07-13 DIAGNOSIS — Z20828 Contact with and (suspected) exposure to other viral communicable diseases: Secondary | ICD-10-CM | POA: Diagnosis not present

## 2019-07-18 DIAGNOSIS — Z20828 Contact with and (suspected) exposure to other viral communicable diseases: Secondary | ICD-10-CM | POA: Diagnosis not present

## 2019-07-20 DIAGNOSIS — Z20828 Contact with and (suspected) exposure to other viral communicable diseases: Secondary | ICD-10-CM | POA: Diagnosis not present

## 2019-07-20 DIAGNOSIS — Z5181 Encounter for therapeutic drug level monitoring: Secondary | ICD-10-CM | POA: Diagnosis not present

## 2019-07-26 DIAGNOSIS — Z20828 Contact with and (suspected) exposure to other viral communicable diseases: Secondary | ICD-10-CM | POA: Diagnosis not present

## 2019-07-27 DIAGNOSIS — Z5181 Encounter for therapeutic drug level monitoring: Secondary | ICD-10-CM | POA: Diagnosis not present

## 2019-07-28 DIAGNOSIS — Z20828 Contact with and (suspected) exposure to other viral communicable diseases: Secondary | ICD-10-CM | POA: Diagnosis not present

## 2019-08-02 DIAGNOSIS — Z5181 Encounter for therapeutic drug level monitoring: Secondary | ICD-10-CM | POA: Diagnosis not present

## 2019-08-02 DIAGNOSIS — Z20828 Contact with and (suspected) exposure to other viral communicable diseases: Secondary | ICD-10-CM | POA: Diagnosis not present

## 2019-08-04 DIAGNOSIS — Z20828 Contact with and (suspected) exposure to other viral communicable diseases: Secondary | ICD-10-CM | POA: Diagnosis not present

## 2019-08-09 DIAGNOSIS — Z20828 Contact with and (suspected) exposure to other viral communicable diseases: Secondary | ICD-10-CM | POA: Diagnosis not present

## 2019-08-09 DIAGNOSIS — Z5181 Encounter for therapeutic drug level monitoring: Secondary | ICD-10-CM | POA: Diagnosis not present

## 2019-08-11 DIAGNOSIS — Z20828 Contact with and (suspected) exposure to other viral communicable diseases: Secondary | ICD-10-CM | POA: Diagnosis not present

## 2019-08-15 ENCOUNTER — Other Ambulatory Visit: Payer: Self-pay

## 2019-08-15 ENCOUNTER — Encounter: Payer: Self-pay | Admitting: Physician Assistant

## 2019-08-15 ENCOUNTER — Ambulatory Visit: Payer: Self-pay | Admitting: Physician Assistant

## 2019-08-15 VITALS — BP 115/66 | Ht 62.0 in | Wt 133.2 lb

## 2019-08-15 DIAGNOSIS — Z3009 Encounter for other general counseling and advice on contraception: Secondary | ICD-10-CM | POA: Diagnosis not present

## 2019-08-15 DIAGNOSIS — Z113 Encounter for screening for infections with a predominantly sexual mode of transmission: Secondary | ICD-10-CM

## 2019-08-15 DIAGNOSIS — Z0389 Encounter for observation for other suspected diseases and conditions ruled out: Secondary | ICD-10-CM | POA: Diagnosis not present

## 2019-08-15 DIAGNOSIS — Z1388 Encounter for screening for disorder due to exposure to contaminants: Secondary | ICD-10-CM | POA: Diagnosis not present

## 2019-08-15 LAB — WET PREP FOR TRICH, YEAST, CLUE
Trichomonas Exam: NEGATIVE
Yeast Exam: NEGATIVE

## 2019-08-15 MED ORDER — NORGESTIMATE-ETH ESTRADIOL 0.25-35 MG-MCG PO TABS: 1.0000 | ORAL_TABLET | Freq: Every day | ORAL | 3 refills | Status: DC

## 2019-08-15 NOTE — Progress Notes (Signed)
Wet mount reviewed with provider and no treatment needed for wet mount today per provider order as pt is not having any symptoms. Pt aware that Rx for birth control pills will be sent to pharmacy on file by provider. Counseled pt that when she picks up her last pack of pills to give [place] a call so she can RTC prior to running out of pills. Counseled pt per provider orders and pt states understanding. Provider orders completed.Lyman Speller, RN

## 2019-08-15 NOTE — Progress Notes (Signed)
Pt would like to get on birth control, and here for STD check.Lyman Speller, RN

## 2019-08-15 NOTE — Progress Notes (Signed)
Pt scheduled for STD screening. Pt reports she would like to discuss birth control with provider and get on birth control today. Appt changed to [facility] Prob visit with STI check. Please see [facility] visit encounter for remainder of visit.Lyman Speller, RN

## 2019-08-16 ENCOUNTER — Encounter: Payer: Self-pay | Admitting: Physician Assistant

## 2019-08-16 DIAGNOSIS — Z20828 Contact with and (suspected) exposure to other viral communicable diseases: Secondary | ICD-10-CM | POA: Diagnosis not present

## 2019-08-16 LAB — PAP IG (IMAGE GUIDED): PAP Smear Comment: 0

## 2019-08-16 NOTE — Progress Notes (Signed)
Family Planning Visit- Repeat Yearly Visit  Subjective:  Loretta Bird is a 24 y.o. G3P0010  being seen today for an well woman visit and to discuss family planning options.    She is currently using condoms sometimes for pregnancy prevention. Patient reports she does not want a pregnancy in the next year. Patient  has ERRONEOUS ENCOUNTER--DISREGARD on their problem list.  Chief Complaint  Patient presents with  . Contraception    interested in birth control  . SEXUALLY TRANSMITTED DISEASE    STD screening    Patient reports that she would like to start [facility] and would like to have a period regularly.  States that she has used the [place] in the past but had it removed due to irregular bleeding.  Reports that she last had sex 08/01/2019 and LMP 08/09/2019 and normal.  Smokes a cigar every other day and sometimes smokes MJ.  Reports she would like a STD screening due to having a new partner and having an odor to her urine and her urine is darker.     Patient denies any chronic conditions, clotting disorders, and migraine headaches.  Denies any family history that has changed.     See flowsheet for other program required questions.   Body mass index is 24.36 kg/m. - Patient is eligible for diabetes screening based on BMI and age >34?  not applicable HA1C ordered? not applicable  Patient reports 1 of partners in last year. Desires STI screening?  Yes   Has patient been screened once for HCV in the past?  No  No results found for: HCVAB  Does the patient have current of drug use, have a partner with drug use, and/or has been incarcerated since last result? Yes  If yes-- Screen for HCV through [facility]   Does the patient meet criteria for HBV testing? No  Criteria:  -Household, sexual or needle sharing contact with HBV -History of drug use -HIV positive -Those with known Hep C   Health Maintenance Due  Topic Date Due  . COVID-19 Vaccine (1) Never done  . CHLAMYDIA SCREENING   Never done  . TETANUS/TDAP  Never done  . PAP-Cervical Cytology Screening  Never done  . PAP SMEAR-Modifier  Never done    Review of Systems  All other systems reviewed and are negative.   The following portions of the patient's history were reviewed and updated as appropriate: allergies, current medications, past family history, past medical history, past social history, past surgical history and problem list. Problem list updated.  Objective:   Vitals:   08/15/19 0850  BP: 115/66  Weight: 133 lb 3.2 oz (60.4 kg)  Height: 5\' 2"  (1.575 m)    Physical Exam Vitals and nursing note reviewed.  Constitutional:      General: She is not in acute distress.    Appearance: Normal appearance. She is normal weight.  HENT:     Head: Normocephalic and atraumatic.  Eyes:     Conjunctiva/sclera: Conjunctivae normal.  Pulmonary:     Effort: Pulmonary effort is normal.  Chest:     Breasts: Tanner Score is 5.        Right: Normal.        Left: Normal.  Abdominal:     Palpations: Abdomen is soft. There is no mass.     Tenderness: There is no abdominal tenderness. There is no guarding or rebound.  Genitourinary:    General: Normal vulva.     Rectum: Normal.  Comments: External genitalia/pubic area without nits, lice, edema, erythema, lesions and inguinal adenopathy. Vagina with normal mucosa and discharge. Cervix without visible lesions. Uterus firm, mobile, nt, no masses, no CMT, no adnexal tenderness or fullness. Musculoskeletal:     Cervical back: Neck supple. No tenderness.  Lymphadenopathy:     Cervical: No cervical adenopathy.     Upper Body:     Right upper body: No supraclavicular, axillary or pectoral adenopathy.     Left upper body: No supraclavicular, axillary or pectoral adenopathy.  Skin:    General: Skin is warm and dry.     Findings: No bruising, erythema, lesion or rash.  Neurological:     Mental Status: She is alert and oriented to person, place, and time.   Psychiatric:        Mood and Affect: Mood normal.        Behavior: Behavior normal.        Thought Content: Thought content normal.        Judgment: Judgment normal.       Assessment and Plan:  Loretta Bird is a 24 y.o. female G3P0010 presenting to the [facility] Department for an yearly well woman exam/family planning visit  Contraception counseling: Reviewed all forms of birth control options in the tiered based approach. available including abstinence; over the counter/barrier methods; hormonal contraceptive medication including pill, patch, ring, injection,contraceptive implant, ECP; hormonal and nonhormonal IUDs; permanent sterilization options including vasectomy and the various tubal sterilization modalities. Risks, benefits, and typical effectiveness rates were reviewed.  Questions were answered.  Written information was also given to the patient to review.  Patient desires to start OCs, this was prescribed for patient. She will follow up in  3 months and prn for surveillance.  She was told to call with any further questions, or with any concerns about this method of contraception.  Emphasized use of condoms 100% of the time for STI prevention.  Patient was not a candidate for ECP.  1. Encounter for counseling regarding contraception Reviewed with patient above information about BCMs. States she would like to have regular periods and opts to try OCs. Rec condoms with all sex for 2 weeks of first cycle if late OCs. Counseled patient on s/s of when to d/c OC and call clinic.  2. Screening for STD (sexually transmitted disease) Await test results.  Counseled that RN will call if needs to RTC for treatment once results are back.  - WET PREP FOR TRICH, YEAST, CLUE - Chlamydia/Gonorrhea [place] [facility] - HIV/HCV [place] [facility] - Syphilis Serology, [place] [facility]  3. OCP (oral contraceptive pills) initiation May start Sprintec 28d 1 po daily at the same time each day  today. Rx sent to pharmacy on file and has 3 refills. Counseled that we will have her RTC to check BP on OCs in 2-3 months and send Rx for rest of year supply at that time if patient desires to continue with OCs. - norgestimate-ethinyl estradiol (Remy 28) 0.25-35 MG-MCG tablet; Take 1 tablet by mouth daily.  Dispense: 1 Package; Refill: 3  4. Pap smear, as part of routine gynecological examination Await test results.  Counseled that RN will call or send a letter once results are back.  - Pap IG (Image Guided)     Return in about 10 weeks (around 10/24/2019) for assess BP on OCs .  No future appointments.  Jerene Dilling, PA

## 2019-08-17 DIAGNOSIS — Z5181 Encounter for therapeutic drug level monitoring: Secondary | ICD-10-CM | POA: Diagnosis not present

## 2019-08-18 DIAGNOSIS — Z20828 Contact with and (suspected) exposure to other viral communicable diseases: Secondary | ICD-10-CM | POA: Diagnosis not present

## 2019-08-19 LAB — HM HEPATITIS C SCREENING LAB: HM Hepatitis Screen: NEGATIVE

## 2019-08-19 LAB — HM HIV SCREENING LAB: HM HIV Screening: NEGATIVE

## 2019-08-24 DIAGNOSIS — Z5181 Encounter for therapeutic drug level monitoring: Secondary | ICD-10-CM | POA: Diagnosis not present

## 2019-08-30 DIAGNOSIS — Z5181 Encounter for therapeutic drug level monitoring: Secondary | ICD-10-CM | POA: Diagnosis not present

## 2019-09-06 DIAGNOSIS — Z5181 Encounter for therapeutic drug level monitoring: Secondary | ICD-10-CM | POA: Diagnosis not present

## 2019-09-13 DIAGNOSIS — Z5181 Encounter for therapeutic drug level monitoring: Secondary | ICD-10-CM | POA: Diagnosis not present

## 2019-09-22 DIAGNOSIS — Z419 Encounter for procedure for purposes other than remedying health state, unspecified: Secondary | ICD-10-CM | POA: Diagnosis not present

## 2019-10-23 DIAGNOSIS — Z419 Encounter for procedure for purposes other than remedying health state, unspecified: Secondary | ICD-10-CM | POA: Diagnosis not present

## 2019-11-19 ENCOUNTER — Emergency Department: Payer: Medicaid Other

## 2019-11-19 ENCOUNTER — Emergency Department
Admission: EM | Admit: 2019-11-19 | Discharge: 2019-11-19 | Disposition: A | Discharge status: Home / Self Care | Payer: Medicaid Other | Attending: Emergency Medicine | Admitting: Emergency Medicine

## 2019-11-19 ENCOUNTER — Other Ambulatory Visit: Payer: Self-pay

## 2019-11-19 DIAGNOSIS — T7411XA Adult physical abuse, confirmed, initial encounter: Secondary | ICD-10-CM | POA: Diagnosis not present

## 2019-11-19 DIAGNOSIS — Z5321 Procedure and treatment not carried out due to patient leaving prior to being seen by health care provider: Secondary | ICD-10-CM | POA: Diagnosis not present

## 2019-11-19 NOTE — ED Notes (Signed)
Patient called with no answer. °

## 2019-11-19 NOTE — ED Notes (Signed)
Patient called for ct with no answer. 
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
## 2019-11-21 ENCOUNTER — Telehealth: Payer: Self-pay | Admitting: *Deleted

## 2019-11-21 NOTE — Telephone Encounter (Signed)
Loretta Bird presented to the ED and left before being seen by the provider on 11/19/19. The patient has been enrolled in an automated general discharge outreach program and 2 attempts to contact the patient will be made to follow up on their ED visit and subsequent needs. The care management team is available to provide assistance to this patient at any time.   Burnard Bunting, RN, BSN, CCRN [facility] [phone]

## 2019-11-23 DIAGNOSIS — Z419 Encounter for procedure for purposes other than remedying health state, unspecified: Secondary | ICD-10-CM | POA: Diagnosis not present

## 2019-12-23 DIAGNOSIS — Z419 Encounter for procedure for purposes other than remedying health state, unspecified: Secondary | ICD-10-CM | POA: Diagnosis not present

## 2020-01-23 DIAGNOSIS — Z419 Encounter for procedure for purposes other than remedying health state, unspecified: Secondary | ICD-10-CM | POA: Diagnosis not present

## 2020-02-22 DIAGNOSIS — Z419 Encounter for procedure for purposes other than remedying health state, unspecified: Secondary | ICD-10-CM | POA: Diagnosis not present

## 2020-03-24 DIAGNOSIS — Z419 Encounter for procedure for purposes other than remedying health state, unspecified: Secondary | ICD-10-CM | POA: Diagnosis not present

## 2020-04-24 DIAGNOSIS — Z419 Encounter for procedure for purposes other than remedying health state, unspecified: Secondary | ICD-10-CM | POA: Diagnosis not present

## 2020-05-22 DIAGNOSIS — Z419 Encounter for procedure for purposes other than remedying health state, unspecified: Secondary | ICD-10-CM | POA: Diagnosis not present

## 2020-06-22 DIAGNOSIS — Z419 Encounter for procedure for purposes other than remedying health state, unspecified: Secondary | ICD-10-CM | POA: Diagnosis not present

## 2020-07-22 DIAGNOSIS — Z419 Encounter for procedure for purposes other than remedying health state, unspecified: Secondary | ICD-10-CM | POA: Diagnosis not present

## 2020-08-08 ENCOUNTER — Other Ambulatory Visit: Payer: Self-pay

## 2020-08-08 ENCOUNTER — Ambulatory Visit: Payer: Medicaid Other | Admitting: Physician Assistant

## 2020-08-08 DIAGNOSIS — N760 Acute vaginitis: Secondary | ICD-10-CM

## 2020-08-08 DIAGNOSIS — B9689 Other specified bacterial agents as the cause of diseases classified elsewhere: Secondary | ICD-10-CM

## 2020-08-08 DIAGNOSIS — Z113 Encounter for screening for infections with a predominantly sexual mode of transmission: Secondary | ICD-10-CM

## 2020-08-08 LAB — WET PREP FOR TRICH, YEAST, CLUE
Trichomonas Exam: NEGATIVE
Yeast Exam: NEGATIVE

## 2020-08-08 MED ORDER — METRONIDAZOLE 500 MG PO TABS: 500.0000 mg | ORAL_TABLET | Freq: Two times a day (BID) | ORAL | 0 refills | Status: AC

## 2020-08-08 NOTE — Progress Notes (Signed)
Pt here for STD screening.  Wet mount results reviewed.  Medications dispensed per Provider orders. Berdie Ogren, RN

## 2020-08-09 ENCOUNTER — Encounter: Payer: Self-pay | Admitting: Physician Assistant

## 2020-08-09 NOTE — Progress Notes (Signed)
[facility] Department STI clinic/screening visit  Subjective:  Loretta Bird is a 25 y.o. female being seen today for an STI screening visit. The patient reports they do have symptoms.  Patient reports that they do not desire a pregnancy in the next year.   They reported they are not interested in discussing contraception today.  No LMP recorded.   Patient has the following medical conditions:  There are no problems to display for this patient.   Chief Complaint  Patient presents with  . SEXUALLY TRANSMITTED DISEASE    screening     HPI  Patient reports that she has had a white discharge with some cramping, like a period for about 2.5 weeks.  Denies chronic conditions and regular medicines.  States that her last HIV test was about 1 year ago and last pap was 07/2019.  LMP was 07/22/2020 and is using condoms sometimes as her BCM.   See flowsheet for further details and programmatic requirements.    The following portions of the patient's history were reviewed and updated as appropriate: allergies, current medications, past medical history, past social history, past surgical history and problem list.  Objective:  There were no vitals filed for this visit.  Physical Exam Constitutional:      General: She is not in acute distress.    Appearance: Normal appearance.  HENT:     Head: Normocephalic and atraumatic.     Comments: No nits,lice, or hair loss. No cervical, supraclavicular or axillary adenopathy.    Mouth/Throat:     Mouth: Mucous membranes are moist.     Pharynx: Oropharynx is clear. No oropharyngeal exudate or posterior oropharyngeal erythema.  Eyes:     Conjunctiva/sclera: Conjunctivae normal.  Pulmonary:     Effort: Pulmonary effort is normal.  Abdominal:     Palpations: Abdomen is soft. There is no mass.     Tenderness: There is no abdominal tenderness. There is no guarding or rebound.  Genitourinary:    General: Normal vulva.     Rectum:  Normal.     Comments: External genitalia/pubic area without nits, lice, edema, erythema, lesions and inguinal adenopathy. Vagina with normal mucosa and small amount of thin, white discharge, pH=>4.5. Cervix without visible lesions. Uterus firm, mobile, nt, no masses, no CMT, no adnexal tenderness or fullness. Musculoskeletal:     Cervical back: Neck supple. No tenderness.  Skin:    General: Skin is warm and dry.     Findings: No bruising, erythema, lesion or rash.  Neurological:     Mental Status: She is alert and oriented to person, place, and time.  Psychiatric:        Mood and Affect: Mood normal.        Behavior: Behavior normal.        Thought Content: Thought content normal.        Judgment: Judgment normal.      Assessment and Plan:  Loretta Bird is a 25 y.o. female presenting to the [facility] Department for STI screening  1. Screening for STD (sexually transmitted disease) Patient into clinic with symptoms. Patient declines blood work today.  Rec condoms with all sex. Await test results.  Counseled that RN will call if needs to RTC for treatment once results are back. - WET PREP FOR TRICH, YEAST, CLUE - Gonococcus culture - Chlamydia/Gonorrhea [place] [facility]  2. BV (bacterial vaginosis) Treat for BV with Metronidazole 500 mg #14 1 po BID for 7 days with food, no  EtOH for 24 hr before and until 72 hr after completing medicine. No sex for 10 days. Enc to use OTC antifungal cream  If has itching during or just after antibiotic use. - metroNIDAZOLE (FLAGYL) 500 MG tablet; Take 1 tablet (500 mg total) by mouth 2 (two) times daily for 7 days.  Dispense: 14 tablet; Refill: 0     No follow-ups on file.  No future appointments.  Matt Holmes, PA

## 2020-08-12 LAB — GONOCOCCUS CULTURE

## 2020-08-22 DIAGNOSIS — Z419 Encounter for procedure for purposes other than remedying health state, unspecified: Secondary | ICD-10-CM | POA: Diagnosis not present

## 2020-09-21 DIAGNOSIS — Z419 Encounter for procedure for purposes other than remedying health state, unspecified: Secondary | ICD-10-CM | POA: Diagnosis not present

## 2020-10-22 DIAGNOSIS — Z419 Encounter for procedure for purposes other than remedying health state, unspecified: Secondary | ICD-10-CM | POA: Diagnosis not present

## 2020-11-08 DIAGNOSIS — N3001 Acute cystitis with hematuria: Secondary | ICD-10-CM | POA: Diagnosis not present

## 2020-11-08 DIAGNOSIS — B373 Candidiasis of vulva and vagina: Secondary | ICD-10-CM | POA: Diagnosis not present

## 2020-11-08 DIAGNOSIS — Z708 Other sex counseling: Secondary | ICD-10-CM | POA: Diagnosis not present

## 2020-11-08 DIAGNOSIS — R300 Dysuria: Secondary | ICD-10-CM | POA: Diagnosis not present

## 2020-11-22 DIAGNOSIS — Z419 Encounter for procedure for purposes other than remedying health state, unspecified: Secondary | ICD-10-CM | POA: Diagnosis not present

## 2020-12-22 DIAGNOSIS — Z419 Encounter for procedure for purposes other than remedying health state, unspecified: Secondary | ICD-10-CM | POA: Diagnosis not present

## 2021-01-22 DIAGNOSIS — Z419 Encounter for procedure for purposes other than remedying health state, unspecified: Secondary | ICD-10-CM | POA: Diagnosis not present

## 2021-02-01 ENCOUNTER — Other Ambulatory Visit: Payer: Self-pay

## 2021-02-01 ENCOUNTER — Encounter (HOSPITAL_COMMUNITY): Payer: Self-pay | Admitting: Emergency Medicine

## 2021-02-01 ENCOUNTER — Emergency Department (HOSPITAL_COMMUNITY)
Admission: EM | Admit: 2021-02-01 | Discharge: 2021-02-02 | Disposition: A | Discharge status: Home / Self Care | Payer: Medicaid Other | Attending: Emergency Medicine | Admitting: Emergency Medicine

## 2021-02-01 DIAGNOSIS — R42 Dizziness and giddiness: Secondary | ICD-10-CM | POA: Diagnosis not present

## 2021-02-01 DIAGNOSIS — R5383 Other fatigue: Secondary | ICD-10-CM | POA: Insufficient documentation

## 2021-02-01 DIAGNOSIS — R519 Headache, unspecified: Secondary | ICD-10-CM | POA: Diagnosis not present

## 2021-02-01 DIAGNOSIS — R55 Syncope and collapse: Secondary | ICD-10-CM | POA: Diagnosis not present

## 2021-02-01 DIAGNOSIS — F17210 Nicotine dependence, cigarettes, uncomplicated: Secondary | ICD-10-CM | POA: Diagnosis not present

## 2021-02-01 LAB — I-STAT BETA HCG BLOOD, ED (MC, WL, AP ONLY): I-stat hCG, quantitative: <5 m[IU]/mL (ref <5)

## 2021-02-01 LAB — CBC WITH DIFFERENTIAL/PLATELET
Abs Immature Granulocytes: 0.02 10*3/uL (ref 0.00–0.07)
Basophils Absolute: 0 10*3/uL (ref 0.0–0.1)
Basophils Relative: 1 %
Eosinophils Absolute: 0.1 10*3/uL (ref 0.0–0.5)
Eosinophils Relative: 2 %
HCT: 35.1 % — ABNORMAL LOW (ref 36.0–46.0)
Hemoglobin: 11.5 g/dL — ABNORMAL LOW (ref 12.0–15.0)
Immature Granulocytes: 0 %
Lymphocytes Relative: 36 %
Lymphs Abs: 2.9 10*3/uL (ref 0.7–4.0)
MCH: 28.2 pg (ref 26.0–34.0)
MCHC: 32.8 g/dL (ref 30.0–36.0)
MCV: 86 fL (ref 80.0–100.0)
Monocytes Absolute: 0.7 10*3/uL (ref 0.1–1.0)
Monocytes Relative: 9 %
Neutro Abs: 4.3 10*3/uL (ref 1.7–7.7)
Neutrophils Relative %: 52 %
Platelets: 271 10*3/uL (ref 150–400)
RBC: 4.08 MIL/uL (ref 3.87–5.11)
RDW: 12.8 % (ref 11.5–15.5)
WBC: 8.1 10*3/uL (ref 4.0–10.5)
nRBC: 0 % (ref 0.0–0.2)

## 2021-02-01 LAB — BASIC METABOLIC PANEL
Anion gap: 7 (ref 5–15)
BUN: 10 mg/dL (ref 6–20)
CO2: 28 mmol/L (ref 22–32)
Calcium: 9.3 mg/dL (ref 8.9–10.3)
Chloride: 104 mmol/L (ref 98–111)
Creatinine, Ser: 0.65 mg/dL (ref 0.44–1.00)
GFR, Estimated: >60 mL/min (ref >60)
Glucose, Bld: 92 mg/dL (ref 70–99)
Potassium: 3.3 mmol/L — ABNORMAL LOW (ref 3.5–5.1)
Sodium: 139 mmol/L (ref 135–145)

## 2021-02-01 MED ORDER — POTASSIUM CHLORIDE CRYS ER 20 MEQ PO TBCR
40.0000 meq | EXTENDED_RELEASE_TABLET | Freq: Once | ORAL | Status: AC
  Administered 2021-02-01: 40 meq via ORAL
  Filled 2021-02-01: qty 2

## 2021-02-01 MED ORDER — ACETAMINOPHEN 500 MG PO TABS
1000.0000 mg | ORAL_TABLET | Freq: Once | ORAL | Status: AC
  Administered 2021-02-01: 1000 mg via ORAL
  Filled 2021-02-01: qty 2

## 2021-02-01 MED ORDER — DIPHENHYDRAMINE HCL 25 MG PO CAPS
25.0000 mg | ORAL_CAPSULE | Freq: Once | ORAL | Status: AC
  Administered 2021-02-01: 25 mg via ORAL
  Filled 2021-02-01: qty 1

## 2021-02-01 MED ORDER — METOCLOPRAMIDE HCL 10 MG PO TABS
10.0000 mg | ORAL_TABLET | Freq: Once | ORAL | Status: AC
  Administered 2021-02-01: 10 mg via ORAL
  Filled 2021-02-01: qty 1

## 2021-02-01 NOTE — ED Provider Notes (Signed)
Emergency Medicine Provider Triage Evaluation Note  Loretta Bird , a 25 y.o. female  was evaluated in triage.  Pt complains of passing out.  Pt reports using the bathroom and became lightheaded.  Pt reports she put her head on the sink but when she stood up she passed out  Review of Systems  Positive: menses currently Negative: fever no chills no abdominal pain   Physical Exam  BP 111/66 (BP Location: Left Arm)   Pulse 83   Temp 99 F (37.2 C) (Oral)   Resp 16   LMP 01/31/2021   SpO2 97%  Gen:   Awake, no distress   Resp:  Normal effort  MSK:   Moves extremities without difficulty  Other:    Medical Decision Making  Medically screening exam initiated at 8:39 PM.  Appropriate orders placed.  Loretta Bird was informed that the remainder of the evaluation will be completed by another provider, this initial triage assessment does not replace that evaluation, and the importance of remaining in the ED until their evaluation is complete.     Loretta Bird 02/01/21 2041    Loretta Laine, MD 02/04/21 [phone]

## 2021-02-01 NOTE — ED Provider Notes (Signed)
[facility]-EMERGENCY DEPT Provider Note   CSN: [id number] Arrival date & time: 02/01/21  1818     History Chief Complaint  Patient presents with   Loss of Consciousness    Loretta Bird is a 25 y.o. female.  HPI Yesterday felt tired and LH and fatigued. Today woke up and used the toilet and felt LH when she stood up from the toilet so she sat back down but passed out before she left the bathroom. This occurred ~11am this morning.   She states that she did not have a headache preceding her episode of syncope.  She states that she sometimes feels somewhat lightheaded when she stands up from the toilet however has not passed out in the past.  She denies any chest pain abdominal pain nausea vomiting or any pain anywhere other than her head currently.  Denies any neck pain denies any arm or leg injuries.  She denies any weakness numbness slurred speech confusion.  She denies any tongue biting or urinary or bowel incontinence  She states that she does occasionally get headaches.  She describes her headache as 8/10 and frontal headache.  She has not taken any medicine for pain prior to arrival because she was worried that it would mask the pain and she wanted to be evaluated first.      History reviewed. No pertinent past medical history.  There are no problems to display for this patient.   Past Surgical History:  Procedure Laterality Date   DILATION AND EVACUATION N/A 03/26/2018   Procedure: DILATATION AND EVACUATION;  Surgeon: Linzie Collin, MD;  Location: [facility] ORS;  Service: Gynecology;  Laterality: N/A;     OB History     Gravida  3   Para      Term      Preterm      AB  1   Living         SAB      IAB      Ectopic      Multiple      Live Births              No family history on file.  Social History   Tobacco Use   Smoking status: Some Days    Types: Cigarettes   Smokeless tobacco: Never   Tobacco comments:     started about 2 years ago per pt  Vaping Use   Vaping Use: Some days   Devices: Juul  Substance Use Topics   Alcohol use: No   Drug use: Yes    Types: Marijuana    Home Medications Prior to Admission medications   Medication Sig Start Date End Date Taking? Authorizing Provider  Multiple Vitamins-Minerals (WOMENS MULTIVITAMIN) TABS Take 1 tablet by mouth every other day.   Yes [provider]    Allergies    Patient has no known allergies.  Review of Systems   Review of Systems  Constitutional:  Negative for chills and fever.  HENT:  Negative for congestion.   Eyes:  Negative for pain.  Respiratory:  Negative for cough and shortness of breath.   Cardiovascular:  Negative for chest pain and leg swelling.  Gastrointestinal:  Negative for abdominal pain, diarrhea, nausea and vomiting.  Genitourinary:  Negative for dysuria.  Musculoskeletal:  Negative for myalgias.  Skin:  Negative for rash.  Neurological:  Positive for syncope and headaches. Negative for dizziness.   Physical Exam Updated Vital Signs BP 106/76  Pulse 73   Temp 99 F (37.2 C) (Oral)   Resp 15   LMP 01/31/2021   SpO2 98%   Physical Exam Vitals and nursing note reviewed.  Constitutional:      General: She is not in acute distress. HENT:     Head: Normocephalic and atraumatic.     Nose: Nose normal.  Eyes:     General: No scleral icterus. Cardiovascular:     Rate and Rhythm: Normal rate and regular rhythm.     Pulses: Normal pulses.     Heart sounds: Normal heart sounds.  Pulmonary:     Effort: Pulmonary effort is normal. No respiratory distress.     Breath sounds: No wheezing.  Abdominal:     Palpations: Abdomen is soft.     Tenderness: There is no abdominal tenderness.  Musculoskeletal:     Cervical back: Normal range of motion.     Right lower leg: No edema.     Left lower leg: No edema.     Comments: No bony tenderness over joints or long bones of the upper and lower extremities.     No neck or back midline tenderness, step-off, deformity, or bruising. Able to turn head left and right 45 degrees without difficulty.  Full range of motion of upper and lower extremity joints shown after palpation was conducted; with 5/5 symmetrical strength in upper and lower extremities. No chest wall tenderness, no facial or cranial tenderness.   Patient has intact sensation grossly in lower and upper extremities. Intact patellar and ankle reflexes. Patient able to ambulate without difficulty.  Radial and DP pulses palpated BL.     Skin:    General: Skin is warm and dry.     Capillary Refill: Capillary refill takes less than 2 seconds.  Neurological:     Mental Status: She is alert. Mental status is at baseline.     Comments: Alert and oriented to self, place, time and event.   Speech is fluent, clear without dysarthria or dysphasia.   Strength 5/5 in upper/lower extremities   Sensation intact in upper/lower extremities   Normal gait.   CN I not tested  CN II grossly intact visual fields bilaterally. Did not visualize posterior eye.  CN III, IV, VI PERRLA and EOMs intact bilaterally  CN V Intact sensation to sharp and light touch to the face  CN VII facial movements symmetric  CN VIII not tested  CN IX, X no uvula deviation, symmetric rise of soft palate  CN XI 5/5 SCM and trapezius strength bilaterally  CN XII Midline tongue protrusion, symmetric L/R movements   Psychiatric:        Mood and Affect: Mood normal.        Behavior: Behavior normal.    ED Results / Procedures / Treatments   Labs (all labs ordered are listed, but only abnormal results are displayed) Labs Reviewed  CBC WITH DIFFERENTIAL/PLATELET - Abnormal; Notable for the following components:      Result Value   Hemoglobin 11.5 (*)    HCT 35.1 (*)    All other components within normal limits  BASIC METABOLIC PANEL - Abnormal; Notable for the following components:   Potassium 3.3 (*)    All other  components within normal limits  I-STAT BETA HCG BLOOD, ED ([facility], [facility], [facility] ONLY)    EKG None  Radiology CT Head Wo Contrast  Result Date: 02/02/2021 CLINICAL DATA:  Syncope EXAM: CT HEAD WITHOUT CONTRAST TECHNIQUE: Contiguous axial images  were obtained from the base of the skull through the vertex without intravenous contrast. COMPARISON:  None. FINDINGS: Brain: No evidence of acute infarction, hemorrhage, hydrocephalus, extra-axial collection or mass lesion/mass effect. Vascular: No hyperdense vessel or unexpected calcification. Skull: Normal. Negative for fracture or focal lesion. Sinuses/Orbits: The visualized paranasal sinuses are essentially clear. The mastoid air cells are unopacified. Other: None. IMPRESSION: Normal head CT. Electronically Signed   By: Charline Bills M.D.   On: 02/02/2021 02:49    Procedures Procedures   Medications Ordered in ED Medications  metoCLOPramide (REGLAN) tablet 10 mg (10 mg Oral Given 02/01/21 2311)  diphenhydrAMINE (BENADRYL) capsule 25 mg (25 mg Oral Given 02/01/21 2311)  acetaminophen (TYLENOL) tablet 1,000 mg (1,000 mg Oral Given 02/01/21 2311)  potassium chloride SA (KLOR-CON) CR tablet 40 mEq (40 mEq Oral Given 02/01/21 2312)    ED Course  I have reviewed the triage vital signs and the nursing notes.  Pertinent labs & imaging results that were available during my care of the patient were reviewed by me and considered in my medical decision making (see chart for details).  Clinical Course as of 02/02/21 0302  Caleen Essex Feb 01, 2021  2233 Yesterday felt tired and [facility] and fatigued. Today woke up and used the toilet and felt LH when she stood up from the toilet so she sat back down but passed out before she left the bathroom. This occurred ~11am this morning.  [WF]    Clinical Course User Index [WF] Gailen Shelter, [place]   MDM Rules/Calculators/A&P                          Patient with reassuring physical exam neurologically intact well-appearing.   Given Tylenol Reglan and Benadryl for headache with some improvement  She is tolerating p.o. has eaten and drank food in the ER and feels somewhat improved  I said she is negative for pregnancy CBC without leukocytosis or clinically significant anemia BMP unremarkable.  EKG with no evidence of ischemia  Patient has been monitored here in the ER for nearly 9 hours with delay in due to wait times and delayed CT scan  I have very low suspicion for subarachnoid hemorrhage her headache did not precede her syncope but rather followed and to be the result of some dehydration/not eating food this morning.  She is neurologically intact and we had lengthy discussion about return precautions.  She will follow-up with primary care provider.  Return precautions given she is understanding of these.  All questions answered to the best my ability.  Final Clinical Impression(s) / ED Diagnoses Final diagnoses:  Syncope, unspecified syncope type  Nonintractable headache, unspecified chronicity pattern, unspecified headache type    Rx / DC Orders ED Discharge Orders     None        Gailen Shelter, [place] 02/02/21 0304    Palumbo, April, MD 02/02/21 0335

## 2021-02-01 NOTE — ED Triage Notes (Signed)
Patient presents for a syncopal episode. Patient states she started her period yesterday. Patient states she has been having episodes of light headedness. Patient states today she stood up and was light headed, and after using the restroom had a syncopal episode. No head injury, but patient does endorse a headache. Patient denies other symptoms, no vomiting, no diarrhea, no urinary symptoms, no abnormal bleeding.

## 2021-02-02 ENCOUNTER — Emergency Department (HOSPITAL_COMMUNITY): Payer: Medicaid Other

## 2021-02-02 DIAGNOSIS — R55 Syncope and collapse: Secondary | ICD-10-CM | POA: Diagnosis not present

## 2021-02-02 NOTE — ED Notes (Signed)
Patient transported to CT 

## 2021-02-02 NOTE — Discharge Instructions (Signed)
Your CT scan was normal.  Given the reassuring scenario in which you passed out I would recommend closely monitoring her symptoms and returning to the ER should experience any new or concerning symptoms.  Also following up with your primary care provider I have given you the information for the [facility] health wellness center.  You may follow-up with them or a provider of your choosing.  I recommend following up in the next week or 2 if possible.  Drink plenty of water eat plenty of food  For headache you may also obtain Tylenol ibuprofen as discussed below  Please use Tylenol or ibuprofen for pain.  You may use 600 mg ibuprofen every 6 hours or 1000 mg of Tylenol every 6 hours.  You may choose to alternate between the 2.  This would be most effective.  Not to exceed 4 g of Tylenol within 24 hours.  Not to exceed 3200 mg ibuprofen 24 hours.

## 2021-02-04 ENCOUNTER — Telehealth: Payer: Self-pay

## 2021-02-04 NOTE — Telephone Encounter (Signed)
Transition Care Management Unsuccessful Follow-up Telephone Call  Date of discharge and from where:  02/02/2021-[facility]   Attempts:  1st Attempt  Reason for unsuccessful TCM follow-up call:  Unable to leave message

## 2021-02-05 NOTE — Telephone Encounter (Signed)
Transition Care Management Unsuccessful Follow-up Telephone Call  Date of discharge and from where:  02/02/2021 from [facility]  Attempts:  2nd Attempt  Reason for unsuccessful TCM follow-up call:  Unable to leave message

## 2021-02-06 NOTE — Telephone Encounter (Signed)
Transition Care Management Follow-up Telephone Call Date of discharge and from where: 02/02/2021 from [facility] How have you been since you were released from the hospital? Pt stated that she is feeling a lot better.  Any questions or concerns? No  Items Reviewed: Did the pt receive and understand the discharge instructions provided? Yes  Medications obtained and verified? Yes  Other? No  Any new allergies since your discharge? No  Dietary orders reviewed? No Do you have support at home? Yes   Functional Questionnaire: (I = Independent and D = Dependent) ADLs: I  Bathing/Dressing- I  Meal Prep- I  Eating- I  Maintaining continence- I  Transferring/Ambulation- I  Managing Meds- I  Follow up appointments reviewed:  PCP Hospital f/u appt confirmed? No  Pt stated that she will call [facility] as recommended.  Specialist Hospital f/u appt confirmed? No   Are transportation arrangements needed? No  If their condition worsens, is the pt aware to call PCP or go to the Emergency Dept.? Yes Was the patient provided with contact information for the PCP's office or ED? Yes Was to pt encouraged to call back with questions or concerns? Yes

## 2021-02-21 DIAGNOSIS — Z419 Encounter for procedure for purposes other than remedying health state, unspecified: Secondary | ICD-10-CM | POA: Diagnosis not present

## 2021-03-24 DIAGNOSIS — Z419 Encounter for procedure for purposes other than remedying health state, unspecified: Secondary | ICD-10-CM | POA: Diagnosis not present

## 2021-04-24 DIAGNOSIS — Z419 Encounter for procedure for purposes other than remedying health state, unspecified: Secondary | ICD-10-CM | POA: Diagnosis not present

## 2021-05-22 DIAGNOSIS — Z419 Encounter for procedure for purposes other than remedying health state, unspecified: Secondary | ICD-10-CM | POA: Diagnosis not present

## 2021-06-22 DIAGNOSIS — Z419 Encounter for procedure for purposes other than remedying health state, unspecified: Secondary | ICD-10-CM | POA: Diagnosis not present

## 2021-07-22 DIAGNOSIS — Z419 Encounter for procedure for purposes other than remedying health state, unspecified: Secondary | ICD-10-CM | POA: Diagnosis not present

## 2021-08-22 DIAGNOSIS — Z419 Encounter for procedure for purposes other than remedying health state, unspecified: Secondary | ICD-10-CM | POA: Diagnosis not present

## 2021-09-12 ENCOUNTER — Ambulatory Visit: Payer: Medicaid Other | Admitting: Nurse Practitioner

## 2021-09-12 ENCOUNTER — Encounter: Payer: Self-pay | Admitting: Nurse Practitioner

## 2021-09-12 DIAGNOSIS — Z113 Encounter for screening for infections with a predominantly sexual mode of transmission: Secondary | ICD-10-CM

## 2021-09-12 LAB — HEPATITIS B SURFACE ANTIGEN: Hepatitis B Surface Ag: NONREACTIVE

## 2021-09-12 LAB — WET PREP FOR TRICH, YEAST, CLUE
Trichomonas Exam: NEGATIVE
Yeast Exam: NEGATIVE

## 2021-09-12 LAB — HM HIV SCREENING LAB: HM HIV Screening: NEGATIVE

## 2021-09-12 LAB — HM HEPATITIS C SCREENING LAB: HM Hepatitis Screen: NEGATIVE

## 2021-09-12 NOTE — Progress Notes (Addendum)
WET PREP negative. No treatment indicated at this time. Instr on further labwork pending with verbalized understanding.  Lethea Killings RN

## 2021-09-12 NOTE — Progress Notes (Signed)
[facility] Department  STI clinic/screening visit [address] [place] [address] [phone]  Subjective:  Loretta Bird is a 26 y.o. female being seen today for an STI screening visit. The patient reports they do not have symptoms.  Patient reports that they do not desire a pregnancy in the next year.   They reported they are not interested in discussing contraception today.    Patient's last menstrual period was 07/26/2021 (exact date).   Patient has the following medical conditions:  There are no problems to display for this patient.   Chief Complaint  Patient presents with   SEXUALLY TRANSMITTED DISEASE    Screening    HPI  Patient reports to clinic today for STD screening.  Patient reports being asymptomatic.   Last HIV test per patient/review of record was 08/19/19 Patient reports last pap was 08/15/19.   Screening for MPX risk: Does the patient have an unexplained rash? No Is the patient MSM? No Does the patient endorse multiple sex partners or anonymous sex partners? No Did the patient have close or sexual contact with a person diagnosed with MPX? No Has the patient traveled outside the [place] where MPX is endemic? No Is there a high clinical suspicion for MPX-- evidenced by one of the following No  -Unlikely to be chickenpox  -Lymphadenopathy  -Rash that present in same phase of evolution on any given body part See flowsheet for further details and programmatic requirements.   Immunization history:  Immunization History  Administered Date(s) Administered   Hepatitis B 02-08-98, 09/27/1997, 03/08/1998   Hpv-Unspecified 11/02/2006, 01/04/2007, 05/28/2010   Tdap 11/09/2008     The following portions of the patient's history were reviewed and updated as appropriate: allergies, current medications, past medical history, past social history, past surgical history and problem list.  Objective:  There were no vitals filed for this  visit.  Physical Exam Constitutional:      Appearance: Normal appearance.  HENT:     Head: Normocephalic. No abrasion, masses or laceration. Hair is normal.     Mouth/Throat:     Lips: Pink.     Mouth: Mucous membranes are moist. No oral lesions.     Dentition: No dental caries.     Pharynx: No oropharyngeal exudate or posterior oropharyngeal erythema.     Tonsils: No tonsillar exudate or tonsillar abscesses.  Eyes:     General: Lids are normal.        Right eye: No discharge.        Left eye: No discharge.     Conjunctiva/sclera: Conjunctivae normal.     Right eye: No exudate.    Left eye: No exudate.    Pupils: Pupils are equal, round, and reactive to light.  Abdominal:     General: Abdomen is flat.     Palpations: Abdomen is soft.     Tenderness: There is no abdominal tenderness. There is no rebound.  Genitourinary:    Pubic Area: No rash or pubic lice.      Labia:        Right: No rash, tenderness, lesion or injury.        Left: No rash, tenderness, lesion or injury.      Vagina: Normal. No vaginal discharge, erythema or lesions.     Cervix: No cervical motion tenderness, discharge, lesion or erythema.     Uterus: Not enlarged and not tender.      Rectum: Normal.     Comments: Amount Discharge:  small  Odor: Yes pH: less than 4.5 Adheres to vaginal wall: No Color: color of discharge matches the Tarvares Lant swab Musculoskeletal:     Cervical back: Full passive range of motion without pain, normal range of motion and neck supple.  Lymphadenopathy:     Cervical: No cervical adenopathy.     Right cervical: No superficial, deep or posterior cervical adenopathy.    Left cervical: No superficial, deep or posterior cervical adenopathy.     Upper Body:     Right upper body: No supraclavicular, axillary or epitrochlear adenopathy.     Left upper body: No supraclavicular, axillary or epitrochlear adenopathy.     Lower Body: No right inguinal adenopathy. No left inguinal adenopathy.   Skin:    General: Skin is warm and dry.     Findings: No lesion or rash.  Neurological:     Mental Status: She is alert and oriented to person, place, and time.  Psychiatric:        Attention and Perception: Attention normal.        Mood and Affect: Mood normal.        Speech: Speech normal.        Behavior: Behavior normal. Behavior is cooperative.      Assessment and Plan:  Loretta Bird is a 26 y.o. female presenting to the [facility] Department for STI screening  1. Screening examination for venereal disease -26 year old female in clinic for STD screening. -Patient accepted all screenings including oral GC, vaginal CT/GC, wet prep,  and bloodwork for HIV/RPR.  Patient meets criteria for HepB screening? Yes. Ordered? Yes Patient meets criteria for HepC screening? Yes. Ordered? Yes  Treat wet prep per standing order Discussed time line for State Lab results and that patient will be called with positive results and encouraged patient to call if she had not heard in 2 weeks.  Counseled to return or seek care for continued or worsening symptoms Recommended condom use with all sex  Patient is currently not using  contraception  to prevent pregnancy.    - HIV/HCV [facility] - Syphilis Serology, [facility] - HBV Antigen/Antibody State Lab - Chlamydia/Gonorrhea  Lab - WET PREP FOR TRICH, YEAST, CLUE - Gonococcus culture     Return if symptoms worsen or fail to improve.    Glenna Fellows, FNP

## 2021-09-17 LAB — GONOCOCCUS CULTURE

## 2021-09-21 DIAGNOSIS — Z419 Encounter for procedure for purposes other than remedying health state, unspecified: Secondary | ICD-10-CM | POA: Diagnosis not present

## 2021-10-08 ENCOUNTER — Ambulatory Visit (INDEPENDENT_AMBULATORY_CARE_PROVIDER_SITE_OTHER): Payer: Medicaid Other | Admitting: *Deleted

## 2021-10-08 ENCOUNTER — Ambulatory Visit (INDEPENDENT_AMBULATORY_CARE_PROVIDER_SITE_OTHER): Payer: Medicaid Other

## 2021-10-08 VITALS — BP 106/68 | HR 91 | Wt 131.0 lb

## 2021-10-08 DIAGNOSIS — O3680X0 Pregnancy with inconclusive fetal viability, not applicable or unspecified: Secondary | ICD-10-CM

## 2021-10-08 DIAGNOSIS — Z3401 Encounter for supervision of normal first pregnancy, first trimester: Secondary | ICD-10-CM | POA: Diagnosis not present

## 2021-10-08 DIAGNOSIS — Z3481 Encounter for supervision of other normal pregnancy, first trimester: Secondary | ICD-10-CM

## 2021-10-08 DIAGNOSIS — Z3480 Encounter for supervision of other normal pregnancy, unspecified trimester: Secondary | ICD-10-CM | POA: Insufficient documentation

## 2021-10-08 NOTE — Progress Notes (Signed)
New OB Intake  I explained I am completing New OB Intake today. We discussed her EDD of 05/02/22 that is based on LMP. LMP of 07/26/21. Pt is G3/P0. I reviewed her allergies, medications, Medical/Surgical/OB history, and appropriate screenings.   Patient Active Problem List   Diagnosis Date Noted   Supervision of other normal pregnancy, antepartum 10/08/2021    Concerns addressed today  Delivery Plans:  Plans to deliver at [facility].   MyChart/Babyscripts MyChart access verified. I explained pt will have some visits in office and some virtually. Babyscripts app discussed and ordered.   Blood Pressure Cuff  BP cuff  given.  Discussed to be used for virtual visits and or if needed BP checks weekly.    Anatomy US Explained first scheduled [place] will be around 19 weeks.   Labs Discussed Avelina Laine genetic screening with patient. Routine prenatal labs, panorama and horizon drawn today.    Placed OB Box on problem list and updated   Patient informed that the ultrasound is considered a limited obstetric ultrasound and is not intended to be a complete ultrasound exam.  Patient also informed that the ultrasound is not being completed with the intent of assessing for fetal or placental anomalies or any pelvic abnormalities. Explained that the purpose of today's ultrasound is to assess for dating and fetal heart rate.  Patient acknowledges the purpose of the exam and the limitations of the study.      First visit review I reviewed new OB appt with pt. Explained pt will be seen by Dr Macon Large at first visit.    Scheryl Marten, RN 10/08/2021  1:59 PM

## 2021-10-09 LAB — CBC
Hematocrit: 32.8 % — ABNORMAL LOW (ref 34.0–46.6)
Hemoglobin: 11 g/dL — ABNORMAL LOW (ref 11.1–15.9)
MCH: 28.1 pg (ref 26.6–33.0)
MCHC: 33.5 g/dL (ref 31.5–35.7)
MCV: 84 fL (ref 79–97)
Platelets: 255 10*3/uL (ref 150–450)
RBC: 3.91 x10E6/uL (ref 3.77–5.28)
RDW: 12.4 % (ref 11.7–15.4)
WBC: 12 10*3/uL — ABNORMAL HIGH (ref 3.4–10.8)

## 2021-10-09 LAB — HEMOGLOBIN A1C
Est. average glucose Bld gHb Est-mCnc: 103 mg/dL
Hgb A1c MFr Bld: 5.2 % (ref 4.8–5.6)

## 2021-10-09 LAB — ANTIBODY SCREEN: Antibody Screen: NEGATIVE

## 2021-10-09 LAB — COMPREHENSIVE METABOLIC PANEL
ALT: 8 IU/L (ref 0–32)
AST: 11 IU/L (ref 0–40)
Albumin/Globulin Ratio: 1.8 (ref 1.2–2.2)
Albumin: 4.3 g/dL (ref 4.0–5.0)
Alkaline Phosphatase: 43 IU/L — ABNORMAL LOW (ref 44–121)
BUN/Creatinine Ratio: 22 (ref 9–23)
BUN: 11 mg/dL (ref 6–20)
Bilirubin Total: <0.2 mg/dL (ref 0.0–1.2)
CO2: 20 mmol/L (ref 20–29)
Calcium: 9.6 mg/dL (ref 8.7–10.2)
Chloride: 102 mmol/L (ref 96–106)
Creatinine, Ser: 0.51 mg/dL — ABNORMAL LOW (ref 0.57–1.00)
Globulin, Total: 2.4 g/dL (ref 1.5–4.5)
Glucose: 77 mg/dL (ref 70–99)
Potassium: 4.1 mmol/L (ref 3.5–5.2)
Sodium: 137 mmol/L (ref 134–144)
Total Protein: 6.7 g/dL (ref 6.0–8.5)
eGFR: 134 mL/min/{1.73_m2} (ref >59)

## 2021-10-09 LAB — ABO

## 2021-10-09 LAB — RH TYPE: Rh Factor: POSITIVE

## 2021-10-10 LAB — CULTURE, OB URINE

## 2021-10-10 LAB — URINE CULTURE, OB REFLEX

## 2021-10-11 ENCOUNTER — Encounter: Payer: Self-pay | Admitting: Obstetrics & Gynecology

## 2021-10-11 DIAGNOSIS — Z2839 Other underimmunization status: Secondary | ICD-10-CM | POA: Insufficient documentation

## 2021-10-11 DIAGNOSIS — O09899 Supervision of other high risk pregnancies, unspecified trimester: Secondary | ICD-10-CM | POA: Insufficient documentation

## 2021-10-11 LAB — RUBELLA SCREEN: Rubella Antibodies, IGG: <0.9 index — ABNORMAL LOW (ref >0.99)

## 2021-10-11 LAB — SPECIMEN STATUS REPORT

## 2021-10-13 LAB — PANORAMA PRENATAL TEST FULL PANEL:PANORAMA TEST PLUS 5 ADDITIONAL MICRODELETIONS: FETAL FRACTION: 7.1

## 2021-10-18 ENCOUNTER — Encounter: Payer: Self-pay | Admitting: Obstetrics & Gynecology

## 2021-10-18 DIAGNOSIS — D563 Thalassemia minor: Secondary | ICD-10-CM | POA: Insufficient documentation

## 2021-10-18 LAB — HORIZON CUSTOM: REPORT SUMMARY: POSITIVE — AB

## 2021-10-22 ENCOUNTER — Encounter: Payer: Self-pay | Admitting: Obstetrics & Gynecology

## 2021-10-22 ENCOUNTER — Ambulatory Visit (INDEPENDENT_AMBULATORY_CARE_PROVIDER_SITE_OTHER): Payer: Medicaid Other | Admitting: Obstetrics & Gynecology

## 2021-10-22 VITALS — BP 97/63 | HR 94 | Wt 131.0 lb

## 2021-10-22 DIAGNOSIS — Z3480 Encounter for supervision of other normal pregnancy, unspecified trimester: Secondary | ICD-10-CM

## 2021-10-22 DIAGNOSIS — D563 Thalassemia minor: Secondary | ICD-10-CM

## 2021-10-22 DIAGNOSIS — O09899 Supervision of other high risk pregnancies, unspecified trimester: Secondary | ICD-10-CM

## 2021-10-22 DIAGNOSIS — O09891 Supervision of other high risk pregnancies, first trimester: Secondary | ICD-10-CM

## 2021-10-22 DIAGNOSIS — Z2839 Other underimmunization status: Secondary | ICD-10-CM

## 2021-10-22 DIAGNOSIS — Z3A12 12 weeks gestation of pregnancy: Secondary | ICD-10-CM

## 2021-10-22 DIAGNOSIS — Z419 Encounter for procedure for purposes other than remedying health state, unspecified: Secondary | ICD-10-CM | POA: Diagnosis not present

## 2021-10-22 NOTE — Progress Notes (Signed)
History:   Loretta Bird is a 26 y.o. G3P0020 at 54w4dby LMP, early ultrasound being seen today for her first obstetrical visit.  Her obstetrical history is significant for  one AB and one SAB . Pregnancy history fully reviewed.  Patient reports nausea and spitting . Also has constipation. Does not want medications.  No other concerns.      HISTORY: OB History  Gravida Para Term Preterm AB Living  3 0 0 0 2 0  SAB IAB Ectopic Multiple Live Births  1 0 0 0 0    # Outcome Date GA Lbr Len/2nd Weight Sex Delivery Anes PTL Lv  3 Current           2 SAB 2020          1 AB 2017          Last pap smear was done 08/15/2019 and was normal  Past Medical History:  Diagnosis Date   Medical history non-contributory    Past Surgical History:  Procedure Laterality Date   DILATION AND EVACUATION N/A 03/26/2018   Procedure: DILATATION AND EVACUATION;  Surgeon: EHarlin Heys MD;  Location: [facility] ORS;  Service: Gynecology;  Laterality: N/A;   History reviewed. No pertinent family history. Social History   Tobacco Use   Smoking status: Never   Smokeless tobacco: Never   Tobacco comments:    started about 2 years ago per pt  Vaping Use   Vaping Use: Never used  Substance Use Topics   Alcohol use: Not Currently    Comment: occassionally   Drug use: Not Currently    Types: Marijuana    Comment: daily   No Known Allergies Current Outpatient Medications on File Prior to Visit  Medication Sig Dispense Refill   Prenatal Vit-Fe Fumarate-FA (PRENATAL MULTIVITAMIN) TABS tablet Take 1 tablet by mouth daily at 12 noon.     No current facility-administered medications on file prior to visit.   Review of Systems Pertinent items noted in HPI and remainder of comprehensive ROS otherwise negative.  Physical Exam:   Vitals:   10/22/21 0914  BP: 97/63  Pulse: 94  Weight: 131 lb (59.4 kg)   Fetal Heart Rate (bpm): 154   General: well-developed, well-nourished female in no acute  distress  Breasts:  deferred  Skin: normal coloration and turgor, no rashes  Neurologic: oriented, normal, negative, normal mood  Extremities: normal strength, tone, and muscle mass, ROM of all joints is normal  HEENT PERRLA, extraocular movement intact and sclera clear, anicteric  Neck supple and no masses  Cardiovascular: regular rate and rhythm  Respiratory:  no respiratory distress, normal breath sounds  Abdomen: soft, non-tender; bowel sounds normal; no masses,  no organomegaly  Pelvic: deferred   Results for orders placed or performed in visit on 10/08/21 (from the past 504 hour(s))  HORIZON CUSTOM   Collection Time: 10/08/21  1:54 PM  Result Value Ref Range   REPORT SUMMARY Positive (A)    PANEL NAME HCS_OTHER    PANEL NOTES See Notes    REPORT NOTE See Notes    FOOTNOTES See Notes   PANORAMA PRENATAL TEST FULL PANEL   Collection Time: 10/08/21  1:54 PM  Result Value Ref Range   REPORT SUMMARY LOW RISK    REPORT NOTE See Notes    GENDER OF FETUS Female    FETAL FRACTION 7.1%    TRISOMY 21 RESULT TEXT Low Risk    TRISOMY 21 AGE-BASED RISK TEXT 1/983 (  0.1%)    TRISOMY 21 RISK SCORE TEXT <1/10,000 (<0.01%)    TRISOMY 18 RESULT TEXT Low Risk    TRISOMY 18 AGE-BASED RISK TEXT 1/1,993 (0.05%)    TRISOMY 18 RISK SCORE TEXT <1/10,000 (<0.01%)    TRISOMY 13 RESULT TEXT Low Risk    TRISOMY 13 AGE-BASED RISK TEXT 1/6,347 (0.02%)    TRISOMY 13 RISK SCORE TEXT <1/10,000 (<0.01%)    MONOSOMY X RESULT TEXT Low Risk    MONOSOMY X AGE-BASED RISK TEXT 1/255 (0.39%)    MONOSOMY X RISK SCORE TEXT <1/10,000 (<0.01%)    TRIPLOIDY RESULT TEXT Low Risk    FOOTNOTES See Notes   Comprehensive metabolic panel   Collection Time: 10/08/21  2:09 PM  Result Value Ref Range   Glucose 77 70 - 99 mg/dL   BUN 11 6 - 20 mg/dL   Creatinine, Ser 0.51 (L) 0.57 - 1.00 mg/dL   eGFR 134 >59 mL/min/1.73   BUN/Creatinine Ratio 22 9 - 23   Sodium 137 134 - 144 mmol/L   Potassium 4.1 3.5 - 5.2 mmol/L    Chloride 102 96 - 106 mmol/L   CO2 20 20 - 29 mmol/L   Calcium 9.6 8.7 - 10.2 mg/dL   Total Protein 6.7 6.0 - 8.5 g/dL   Albumin 4.3 4.0 - 5.0 g/dL   Globulin, Total 2.4 1.5 - 4.5 g/dL   Albumin/Globulin Ratio 1.8 1.2 - 2.2   Bilirubin Total <0.2 0.0 - 1.2 mg/dL   Alkaline Phosphatase 43 (L) 44 - 121 IU/L   AST 11 0 - 40 IU/L   ALT 8 0 - 32 IU/L  Hemoglobin A1c   Collection Time: 10/08/21  2:09 PM  Result Value Ref Range   Hgb A1c MFr Bld 5.2 4.8 - 5.6 %   Est. average glucose Bld gHb Est-mCnc 103 mg/dL  ABO   Collection Time: 10/08/21  2:09 PM  Result Value Ref Range   ABO Grouping O   Rh Type   Collection Time: 10/08/21  2:09 PM  Result Value Ref Range   Rh Factor Positive   CBC   Collection Time: 10/08/21  2:09 PM  Result Value Ref Range   WBC 12.0 (H) 3.4 - 10.8 x10E3/uL   RBC 3.91 3.77 - 5.28 x10E6/uL   Hemoglobin 11.0 (L) 11.1 - 15.9 g/dL   Hematocrit 32.8 (L) 34.0 - 46.6 %   MCV 84 79 - 97 fL   MCH 28.1 26.6 - 33.0 pg   MCHC 33.5 31.5 - 35.7 g/dL   RDW 12.4 11.7 - 15.4 %   Platelets 255 150 - 450 x10E3/uL  Rubella screen   Collection Time: 10/08/21  2:09 PM  Result Value Ref Range   Rubella Antibodies, IGG <0.90 (L) Immune >0.99 index  Specimen status report   Collection Time: 10/08/21  2:09 PM  Result Value Ref Range   specimen status report Comment   Antibody screen   Collection Time: 10/08/21  2:09 PM  Result Value Ref Range   Antibody Screen Negative Negative  Culture, OB Urine   Collection Time: 10/08/21  2:11 PM   Specimen: Urine   UR  Result Value Ref Range   Urine Culture, OB Final report   Urine Culture, OB Reflex   Collection Time: 10/08/21  2:11 PM  Result Value Ref Range   Organism ID, Bacteria Comment       Assessment:    Pregnancy: [id number] Patient Active Problem List   Diagnosis Date Noted  Alpha thalassemia silent carrier 10/18/2021   Rubella non-immune status, antepartum 10/11/2021   Supervision of other normal pregnancy,  antepartum 10/08/2021     Plan:    1. Alpha thalassemia silent carrier Discussed this with patient and her FOB, FOB testing recommended.   2. Rubella non-immune status, antepartum Discussed with patient, will get MMR vaccine after delivery.  3. [redacted] weeks gestation of pregnancy 4. Supervision of other normal pregnancy, antepartum Prenatal labs reviewed with patient, all questions answered. Reviewed ways to help with her nausea, spitting , constipation.  Told her that medications can be provided if needed.  Continue prenatal vitamins. Problem list reviewed and updated. Ultrasound discussed; fetal anatomic survey: scheduled. Anticipatory guidance about prenatal visits given including labs, ultrasounds, and testing. Weight gain recommendations per IOM guidelines reviewed: underweight/BMI 18.5 or less > 28 - 40 lbs; normal weight/BMI 18.5 - 24.9 > 25 - 35 lbs; overweight/BMI 25 - 29.9 > 15 - 25 lbs; obese/BMI  30 or more > 11 - 20 lbs. Discussed usage of the Babys[employer]s app for more information about pregnancy, and to track blood pressures. Also discussed usage of virtual visits as additional source of managing and completing prenatal visits.  Patient was encouraged to use MyChart to review results, send requests, and have questions addressed.   The nature of [facility] with multiple MDs and Advanced Practice Providers was explained to patient; also emphasized that residents, students are part of our team. Routine obstetric precautions reviewed. Encouraged to seek out care at office or emergency room [facility] preferred) for urgent and/or emergent concerns. Return in about 4 weeks (around 11/19/2021) for OFFICE OB VISIT (MD or APP).     Verita Schneiders, MD, [place] for [employer], [place]

## 2021-11-02 ENCOUNTER — Emergency Department: Payer: Medicaid Other

## 2021-11-02 ENCOUNTER — Other Ambulatory Visit: Payer: Self-pay

## 2021-11-02 ENCOUNTER — Encounter: Payer: Self-pay | Admitting: Intensive Care

## 2021-11-02 ENCOUNTER — Emergency Department
Admission: EM | Admit: 2021-11-02 | Discharge: 2021-11-02 | Disposition: A | Discharge status: Home / Self Care | Payer: Medicaid Other | Attending: Emergency Medicine | Admitting: Emergency Medicine

## 2021-11-02 DIAGNOSIS — O4690 Antepartum hemorrhage, unspecified, unspecified trimester: Secondary | ICD-10-CM

## 2021-11-02 DIAGNOSIS — Z3A14 14 weeks gestation of pregnancy: Secondary | ICD-10-CM | POA: Diagnosis not present

## 2021-11-02 DIAGNOSIS — O99011 Anemia complicating pregnancy, first trimester: Secondary | ICD-10-CM | POA: Diagnosis not present

## 2021-11-02 DIAGNOSIS — O209 Hemorrhage in early pregnancy, unspecified: Secondary | ICD-10-CM | POA: Insufficient documentation

## 2021-11-02 DIAGNOSIS — Z3689 Encounter for other specified antenatal screening: Secondary | ICD-10-CM | POA: Diagnosis not present

## 2021-11-02 LAB — CBC
HCT: 30.6 % — ABNORMAL LOW (ref 36.0–46.0)
Hemoglobin: 10.1 g/dL — ABNORMAL LOW (ref 12.0–15.0)
MCH: 27.7 pg (ref 26.0–34.0)
MCHC: 33 g/dL (ref 30.0–36.0)
MCV: 84.1 fL (ref 80.0–100.0)
Platelets: 242 10*3/uL (ref 150–400)
RBC: 3.64 MIL/uL — ABNORMAL LOW (ref 3.87–5.11)
RDW: 13.1 % (ref 11.5–15.5)
WBC: 10.6 10*3/uL — ABNORMAL HIGH (ref 4.0–10.5)
nRBC: 0 % (ref 0.0–0.2)

## 2021-11-02 LAB — URINALYSIS, ROUTINE W REFLEX MICROSCOPIC
Bilirubin Urine: NEGATIVE
Glucose, UA: NEGATIVE mg/dL
Ketones, ur: NEGATIVE mg/dL
Leukocytes,Ua: NEGATIVE
Nitrite: NEGATIVE
Protein, ur: NEGATIVE mg/dL
Specific Gravity, Urine: 1.015 (ref 1.005–1.030)
pH: 7 (ref 5.0–8.0)

## 2021-11-02 LAB — COMPREHENSIVE METABOLIC PANEL
ALT: 10 U/L (ref 0–44)
AST: 18 U/L (ref 15–41)
Albumin: 3.4 g/dL — ABNORMAL LOW (ref 3.5–5.0)
Alkaline Phosphatase: 33 U/L — ABNORMAL LOW (ref 38–126)
Anion gap: 7 (ref 5–15)
BUN: 6 mg/dL (ref 6–20)
CO2: 23 mmol/L (ref 22–32)
Calcium: 9 mg/dL (ref 8.9–10.3)
Chloride: 105 mmol/L (ref 98–111)
Creatinine, Ser: 0.45 mg/dL (ref 0.44–1.00)
GFR, Estimated: >60 mL/min (ref >60)
Glucose, Bld: 84 mg/dL (ref 70–99)
Potassium: 3.6 mmol/L (ref 3.5–5.1)
Sodium: 135 mmol/L (ref 135–145)
Total Bilirubin: 0.4 mg/dL (ref 0.3–1.2)
Total Protein: 6.7 g/dL (ref 6.5–8.1)

## 2021-11-02 LAB — HCG, QUANTITATIVE, PREGNANCY: hCG, Beta Chain, Quant, S: 74755 m[IU]/mL — ABNORMAL HIGH (ref <5)

## 2021-11-02 NOTE — ED Triage Notes (Signed)
Patient reports waking up around 6:30am to use the restroom and noticed blood on the tissue when wiping. Denies pain

## 2021-11-02 NOTE — ED Provider Notes (Signed)
[facility] Provider Note    Event Date/Time   First MD Initiated Contact with Patient 11/02/21 [phone]     (approximate)   History   Chief Complaint Vaginal Bleeding   HPI  Loretta Bird is a 26 y.o. female G3P0020 at approximately 14 weeks of pregnancy who presents to the ED complaining of vaginal bleeding.  Patient reports that she woke up around 630 this morning and noticed bright red vaginal bleeding.  She states that she has noticed blood whenever she wipes since then, denies having to use a pad or passing clots.  She has not noticed any passage of tissue and denies any vaginal discharge.  She has not had any associated abdominal or pelvic pain.  She previously established with Dr. Macon Large of OB/GYN, reports normal ultrasound at that time.     Physical Exam   Triage Vital Signs: ED Triage Vitals  Enc Vitals Group     BP 11/02/21 0652 114/65     Pulse Rate 11/02/21 0652 (!) 101     Resp 11/02/21 0652 17     Temp 11/02/21 0652 98.2 F (36.8 C)     Temp Source 11/02/21 0652 Oral     SpO2 11/02/21 0652 100 %     Weight 11/02/21 0709 131 lb (59.4 kg)     Height 11/02/21 0709 5\' 2"  (1.575 m)     Head Circumference --      Peak Flow --      Pain Score 11/02/21 0709 0     Pain Loc --      Pain Edu? --      Excl. in GC? --     Most recent vital signs: Vitals:   11/02/21 0652 11/02/21 0956  BP: 114/65 98/61  Pulse: (!) 101 90  Resp: 17 15  Temp: 98.2 F (36.8 C)   SpO2: 100% 100%    Constitutional: Alert and oriented. Eyes: Conjunctivae are normal. Head: Atraumatic. Nose: No congestion/rhinnorhea. Mouth/Throat: Mucous membranes are moist.  Cardiovascular: Normal rate, regular rhythm. Grossly normal heart sounds.  2+ radial pulses bilaterally. Respiratory: Normal respiratory effort.  No retractions. Lungs CTAB. Gastrointestinal: Soft and nontender. No distention. Musculoskeletal: No lower extremity tenderness nor edema.  Neurologic:   Normal speech and language. No gross focal neurologic deficits are appreciated.    ED Results / Procedures / Treatments   Labs (all labs ordered are listed, but only abnormal results are displayed) Labs Reviewed  COMPREHENSIVE METABOLIC PANEL - Abnormal; Notable for the following components:      Result Value   Albumin 3.4 (*)    Alkaline Phosphatase 33 (*)    All other components within normal limits  CBC - Abnormal; Notable for the following components:   WBC 10.6 (*)    RBC 3.64 (*)    Hemoglobin 10.1 (*)    HCT 30.6 (*)    All other components within normal limits  URINALYSIS, ROUTINE W REFLEX MICROSCOPIC - Abnormal; Notable for the following components:   Color, Urine YELLOW (*)    APPearance HAZY (*)    Hgb urine dipstick MODERATE (*)    Bacteria, UA RARE (*)    All other components within normal limits  HCG, QUANTITATIVE, PREGNANCY - Abnormal; Notable for the following components:   hCG, Beta Chain, Quant, S 74,755 (*)    All other components within normal limits   RADIOLOGY Obstetric ultrasound reviewed and interpreted by me with intrauterine pregnancy and appropriate fetal heart rate.  PROCEDURES:  Critical Care performed: No  Procedures   MEDICATIONS ORDERED IN ED: Medications - No data to display   IMPRESSION / MDM / ASSESSMENT AND PLAN / ED COURSE  I reviewed the triage vital signs and the nursing notes.                              26 y.o. female G3P0020 at approximately 14 weeks of pregnancy who presents to the ED complaining of vaginal bleeding starting this morning without pain or discharge.  Patient's presentation is most consistent with acute presentation with potential threat to life or bodily function.  Differential diagnosis includes, but is not limited to, miscarriage, ectopic pregnancy, threatened miscarriage, anemia.  Patient well-appearing and in no acute distress, vital signs are unremarkable and she has a benign abdominal exam.   Bleeding sounds relatively mild and patient has had previous ultrasound showing intrauterine pregnancy.  Labs are reassuring with mild anemia stable compared to previous, no significant leukocytosis.  No significant electrolyte abnormality or AKI, beta hCG level is pending.  Patient is Rh+ and there is no indication for RhoGAM.  Plan to further assess with ultrasound.  Ultrasound is unremarkable, patient with minimal bleeding on reassessment.  She is appropriate for discharge home with OB/GYN follow-up, was counseled to return to the ED for new or worsening symptoms.  Patient agrees with plan.      FINAL CLINICAL IMPRESSION(S) / ED DIAGNOSES   Final diagnoses:  Vaginal bleeding in pregnancy     Rx / DC Orders   ED Discharge Orders     None        Note:  This document was prepared using Dragon voice recognition software and may include unintentional dictation errors.   Chesley Noon, MD 11/02/21 1113

## 2021-11-02 NOTE — ED Notes (Signed)
Lav, green and pink top sent to lab

## 2021-11-19 ENCOUNTER — Encounter: Payer: Self-pay | Admitting: Obstetrics & Gynecology

## 2021-11-19 ENCOUNTER — Ambulatory Visit (INDEPENDENT_AMBULATORY_CARE_PROVIDER_SITE_OTHER): Payer: Medicaid Other | Admitting: Obstetrics & Gynecology

## 2021-11-19 ENCOUNTER — Other Ambulatory Visit (HOSPITAL_COMMUNITY)
Admission: RE | Admit: 2021-11-19 | Discharge: 2021-11-19 | Disposition: A | Discharge status: Home / Self Care | Payer: Medicaid Other | Source: Ambulatory Visit | Attending: Obstetrics & Gynecology | Admitting: Obstetrics & Gynecology

## 2021-11-19 VITALS — BP 106/71 | HR 114 | Wt 137.0 lb

## 2021-11-19 DIAGNOSIS — O23592 Infection of other part of genital tract in pregnancy, second trimester: Secondary | ICD-10-CM | POA: Diagnosis not present

## 2021-11-19 DIAGNOSIS — Z3A16 16 weeks gestation of pregnancy: Secondary | ICD-10-CM | POA: Insufficient documentation

## 2021-11-19 DIAGNOSIS — Z3480 Encounter for supervision of other normal pregnancy, unspecified trimester: Secondary | ICD-10-CM | POA: Diagnosis not present

## 2021-11-19 DIAGNOSIS — B9689 Other specified bacterial agents as the cause of diseases classified elsewhere: Secondary | ICD-10-CM | POA: Insufficient documentation

## 2021-11-19 DIAGNOSIS — N760 Acute vaginitis: Secondary | ICD-10-CM

## 2021-11-19 DIAGNOSIS — Z3482 Encounter for supervision of other normal pregnancy, second trimester: Secondary | ICD-10-CM | POA: Diagnosis not present

## 2021-11-19 NOTE — Progress Notes (Signed)
   PRENATAL VISIT NOTE  Subjective:  Loretta Bird is a 26 y.o. G3P0020 at [redacted]w[redacted]d being seen today for ongoing prenatal care.  She is currently monitored for the following issues for this low-risk pregnancy and has Supervision of other normal pregnancy, antepartum; Rubella non-immune status, antepartum; and Alpha thalassemia silent carrier on their problem list.  Patient reports  vaginal discharge for a few days, wants testing done .  She had isolated episode of vaginal bleeding on 11/05/21  and was evaluated in MAU, evaluation was negative.  Contractions: Not present. Vag. Bleeding: None.  Movement: Absent. Denies leaking of fluid.   The following portions of the patient's history were reviewed and updated as appropriate: allergies, current medications, past family history, past medical history, past social history, past surgical history and problem list.   Objective:   Vitals:   11/19/21 1540  BP: 106/71  Pulse: (!) 114  Weight: 137 lb (62.1 kg)    Fetal Status: Fetal Heart Rate (bpm): 144   Movement: Absent     General:  Alert, oriented and cooperative. Patient is in no acute distress.  Skin: Skin is warm and dry. No rash noted.   Cardiovascular: Normal heart rate noted  Respiratory: Normal respiratory effort, no problems with respiration noted  Abdomen: Soft, gravid, appropriate for gestational age.  Pain/Pressure: Present     Pelvic: Cervical exam performed in the presence of a chaperone Speculum exam done Dilation: Closed Effacement (%): Thick  White discharge seen, testing sample obtained   Extremities: Normal range of motion.  Edema: None  Mental Status: Normal mood and affect. Normal behavior. Normal judgment and thought content.   Assessment and Plan:  Pregnancy: G3P0020 at [redacted]w[redacted]d 1. Vaginitis during pregnancy in second trimester - Cervicovaginal ancillary only( [place]) done, will follow up results and manage accordingly.  2. [redacted] weeks gestation of pregnancy 3.  Supervision of other normal pregnancy, antepartum LR NIPS. AFP done today, anatomy scan ordered. - [place] MFM OB COMP + 14 WK; Future - AFP, Serum, Open Spina Bifida No other complaints or concerns.  Routine obstetric precautions reviewed. Please refer to After Visit Summary for other counseling recommendations.   Return in 4 weeks (on 12/17/2021) for OFFICE OB VISIT (MD only).  Future Appointments  Date Time Provider Department Center  12/09/2021  2:30 PM [facility]  12/17/2021  3:30 PM Milas Hock, MD [facility]  01/14/2022  3:30 PM Keystone Bing, MD [facility]  02/11/2022  8:15 AM [facility] LAB [facility]  02/11/2022  8:35 AM Breslin Burklow, Jethro Bastos, MD [facility]    Jaynie Collins, MD

## 2021-11-21 LAB — AFP, SERUM, OPEN SPINA BIFIDA
AFP MoM: 0.74
AFP Value: 32.1 ng/mL
Gest. Age on Collection Date: 16.6 weeks
Maternal Age At EDD: 24.7 yr
OSBR Risk 1 IN: 10000
Test Results:: NEGATIVE
Weight: 137 [lb_av]

## 2021-11-22 DIAGNOSIS — Z419 Encounter for procedure for purposes other than remedying health state, unspecified: Secondary | ICD-10-CM | POA: Diagnosis not present

## 2021-11-26 LAB — CERVICOVAGINAL ANCILLARY ONLY
Bacterial Vaginitis (gardnerella): POSITIVE — AB
Candida Glabrata: NEGATIVE
Candida Vaginitis: NEGATIVE
Chlamydia: NEGATIVE
Comment: NEGATIVE
Comment: NEGATIVE
Comment: NEGATIVE
Comment: NEGATIVE
Comment: NEGATIVE
Comment: NORMAL
Neisseria Gonorrhea: NEGATIVE
Trichomonas: NEGATIVE

## 2021-11-27 MED ORDER — METRONIDAZOLE 500 MG PO TABS: 500.0000 mg | ORAL_TABLET | Freq: Two times a day (BID) | ORAL | 0 refills | Status: AC

## 2021-11-27 NOTE — Addendum Note (Signed)
Addended by: Jaynie Collins A on: 11/27/2021 08:07 AM   Modules accepted: Orders

## 2021-12-09 ENCOUNTER — Ambulatory Visit: Payer: Medicaid Other | Admitting: *Deleted

## 2021-12-09 ENCOUNTER — Ambulatory Visit: Payer: Medicaid Other | Attending: Obstetrics and Gynecology

## 2021-12-09 ENCOUNTER — Encounter: Payer: Self-pay | Admitting: Obstetrics and Gynecology

## 2021-12-09 ENCOUNTER — Encounter: Payer: Self-pay | Admitting: *Deleted

## 2021-12-09 VITALS — BP 100/55 | HR 87

## 2021-12-09 DIAGNOSIS — Z363 Encounter for antenatal screening for malformations: Secondary | ICD-10-CM | POA: Diagnosis not present

## 2021-12-09 DIAGNOSIS — D563 Thalassemia minor: Secondary | ICD-10-CM

## 2021-12-09 DIAGNOSIS — Z3480 Encounter for supervision of other normal pregnancy, unspecified trimester: Secondary | ICD-10-CM | POA: Insufficient documentation

## 2021-12-09 DIAGNOSIS — Z3A16 16 weeks gestation of pregnancy: Secondary | ICD-10-CM | POA: Diagnosis not present

## 2021-12-09 DIAGNOSIS — O4442 Low lying placenta NOS or without hemorrhage, second trimester: Secondary | ICD-10-CM | POA: Insufficient documentation

## 2021-12-09 DIAGNOSIS — Z3A19 19 weeks gestation of pregnancy: Secondary | ICD-10-CM | POA: Diagnosis not present

## 2021-12-09 DIAGNOSIS — O4440 Low lying placenta NOS or without hemorrhage, unspecified trimester: Secondary | ICD-10-CM | POA: Insufficient documentation

## 2021-12-12 NOTE — Progress Notes (Signed)
   PRENATAL VISIT NOTE  Subjective:  Loretta Bird is a 26 y.o. G3P0020 at [redacted]w[redacted]d being seen today for ongoing prenatal care.  She is currently monitored for the following issues for this low-risk pregnancy and has Supervision of other normal pregnancy, antepartum; Rubella non-immune status, antepartum; Alpha thalassemia silent carrier; and Low lying placenta, antepartum on their problem list.  Patient reports  nipple itching bilaterally .  Contractions: Not present. Vag. Bleeding: None.   . Denies leaking of fluid.   The following portions of the patient's history were reviewed and updated as appropriate: allergies, current medications, past family history, past medical history, past social history, past surgical history and problem list.   Objective:   Vitals:   12/17/21 1538  BP: 103/67  Pulse: 80  Weight: 146 lb (66.2 kg)    Fetal Status: Fetal Heart Rate (bpm): 145         General:  Alert, oriented and cooperative. Patient is in no acute distress.  Skin: Skin is warm and dry. No rash noted.   Cardiovascular: Normal heart rate noted  Respiratory: Normal respiratory effort, no problems with respiration noted  Abdomen: Soft, gravid, appropriate for gestational age.  Pain/Pressure: Absent     Pelvic: Cervical exam deferred        Extremities: Normal range of motion.  Edema: None  Mental Status: Normal mood and affect. Normal behavior. Normal judgment and thought content.   Assessment and Plan:  Pregnancy: P1P2162 at [redacted]w[redacted]d 1. Low lying placenta, antepartum F/U in 5 weeks scheduled. Reviewed implication if it were not to resolve.   2. Alpha thalassemia silent carrier Discussed partner testing - kit given today.   3. Rubella non-immune status, antepartum MMR after delivery  4. Supervision of other normal pregnancy, antepartum Offered and recommended flu shot - pt declines today. She states maybe next time.  AFP wnl MOC: Unsure. Discussed options. She had BTB on Skyla that  was awful and weight loss on Depo.  MOF: Breast  Preterm labor symptoms and general obstetric precautions including but not limited to vaginal bleeding, contractions, leaking of fluid and fetal movement were reviewed in detail with the patient. Please refer to After Visit Summary for other counseling recommendations.   Return in about 4 weeks (around 01/14/2022) for OB VISIT, MD or APP.  Future Appointments  Date Time Provider [place]  01/13/2022  2:30 PM [facility] NURSE [facility]  01/13/2022  2:45 PM [facility] US[facility]  01/14/2022  3:30 PM Aletha Halim, MD [facility]  02/11/2022  8:15 AM [facility] LAB [facility]  02/11/2022  8:35 AM Anyanwu, Sallyanne Havers, MD [facility]    Radene Gunning, MD

## 2021-12-17 ENCOUNTER — Ambulatory Visit (INDEPENDENT_AMBULATORY_CARE_PROVIDER_SITE_OTHER): Payer: Medicaid Other | Admitting: Obstetrics and Gynecology

## 2021-12-17 ENCOUNTER — Encounter: Payer: Self-pay | Admitting: Obstetrics and Gynecology

## 2021-12-17 ENCOUNTER — Other Ambulatory Visit: Payer: Self-pay | Admitting: *Deleted

## 2021-12-17 VITALS — BP 103/67 | HR 80 | Wt 146.0 lb

## 2021-12-17 DIAGNOSIS — Z3A20 20 weeks gestation of pregnancy: Secondary | ICD-10-CM

## 2021-12-17 DIAGNOSIS — D563 Thalassemia minor: Secondary | ICD-10-CM

## 2021-12-17 DIAGNOSIS — Z3482 Encounter for supervision of other normal pregnancy, second trimester: Secondary | ICD-10-CM

## 2021-12-17 DIAGNOSIS — O09892 Supervision of other high risk pregnancies, second trimester: Secondary | ICD-10-CM

## 2021-12-17 DIAGNOSIS — O09899 Supervision of other high risk pregnancies, unspecified trimester: Secondary | ICD-10-CM

## 2021-12-17 DIAGNOSIS — Z3480 Encounter for supervision of other normal pregnancy, unspecified trimester: Secondary | ICD-10-CM

## 2021-12-17 DIAGNOSIS — Z2839 Other underimmunization status: Secondary | ICD-10-CM

## 2021-12-17 DIAGNOSIS — O4440 Low lying placenta NOS or without hemorrhage, unspecified trimester: Secondary | ICD-10-CM

## 2021-12-17 DIAGNOSIS — L299 Pruritus, unspecified: Secondary | ICD-10-CM

## 2021-12-17 DIAGNOSIS — O4442 Low lying placenta NOS or without hemorrhage, second trimester: Secondary | ICD-10-CM

## 2021-12-17 MED ORDER — METRONIDAZOLE 0.75 % VA GEL: 1.0000 | Freq: Every day | VAGINAL | 1 refills | Status: DC

## 2021-12-17 MED ORDER — TRIAMCINOLONE ACETONIDE 0.025 % EX OINT: 1.0000 | TOPICAL_OINTMENT | Freq: Two times a day (BID) | CUTANEOUS | 0 refills | Status: DC

## 2021-12-17 NOTE — Progress Notes (Signed)
ROB [redacted]w[redacted]d  Pt would like to discuss recent U/S today  Pt will get Flu NV  Would like Rx for Metrogel not able to keep pills down.

## 2021-12-20 ENCOUNTER — Encounter: Payer: Self-pay | Admitting: Obstetrics and Gynecology

## 2021-12-22 DIAGNOSIS — Z419 Encounter for procedure for purposes other than remedying health state, unspecified: Secondary | ICD-10-CM | POA: Diagnosis not present

## 2022-01-13 ENCOUNTER — Ambulatory Visit: Payer: Medicaid Other | Attending: Obstetrics and Gynecology

## 2022-01-13 ENCOUNTER — Ambulatory Visit: Payer: Medicaid Other | Admitting: *Deleted

## 2022-01-13 VITALS — BP 109/66 | HR 92

## 2022-01-13 DIAGNOSIS — O4440 Low lying placenta NOS or without hemorrhage, unspecified trimester: Secondary | ICD-10-CM

## 2022-01-13 DIAGNOSIS — Z3480 Encounter for supervision of other normal pregnancy, unspecified trimester: Secondary | ICD-10-CM

## 2022-01-13 DIAGNOSIS — D563 Thalassemia minor: Secondary | ICD-10-CM | POA: Diagnosis not present

## 2022-01-13 DIAGNOSIS — Z148 Genetic carrier of other disease: Secondary | ICD-10-CM | POA: Diagnosis not present

## 2022-01-13 DIAGNOSIS — Z362 Encounter for other antenatal screening follow-up: Secondary | ICD-10-CM | POA: Diagnosis not present

## 2022-01-13 DIAGNOSIS — O285 Abnormal chromosomal and genetic finding on antenatal screening of mother: Secondary | ICD-10-CM | POA: Diagnosis not present

## 2022-01-13 DIAGNOSIS — O4442 Low lying placenta NOS or without hemorrhage, second trimester: Secondary | ICD-10-CM | POA: Insufficient documentation

## 2022-01-13 DIAGNOSIS — Z3A24 24 weeks gestation of pregnancy: Secondary | ICD-10-CM | POA: Diagnosis not present

## 2022-01-14 ENCOUNTER — Encounter: Payer: Medicaid Other | Admitting: Obstetrics and Gynecology

## 2022-01-22 DIAGNOSIS — Z419 Encounter for procedure for purposes other than remedying health state, unspecified: Secondary | ICD-10-CM | POA: Diagnosis not present

## 2022-02-11 ENCOUNTER — Encounter: Payer: Medicaid Other | Admitting: Obstetrics & Gynecology

## 2022-02-11 ENCOUNTER — Other Ambulatory Visit: Payer: Medicaid Other

## 2022-02-12 ENCOUNTER — Ambulatory Visit (INDEPENDENT_AMBULATORY_CARE_PROVIDER_SITE_OTHER): Payer: Medicaid Other | Admitting: Family Medicine

## 2022-02-12 ENCOUNTER — Other Ambulatory Visit: Payer: Medicaid Other

## 2022-02-12 VITALS — BP 108/67 | HR 102 | Wt 166.0 lb

## 2022-02-12 DIAGNOSIS — O4440 Low lying placenta NOS or without hemorrhage, unspecified trimester: Secondary | ICD-10-CM

## 2022-02-12 DIAGNOSIS — O09893 Supervision of other high risk pregnancies, third trimester: Secondary | ICD-10-CM

## 2022-02-12 DIAGNOSIS — Z2839 Other underimmunization status: Secondary | ICD-10-CM

## 2022-02-12 DIAGNOSIS — Z3A28 28 weeks gestation of pregnancy: Secondary | ICD-10-CM

## 2022-02-12 DIAGNOSIS — O4443 Low lying placenta NOS or without hemorrhage, third trimester: Secondary | ICD-10-CM

## 2022-02-12 DIAGNOSIS — Z3480 Encounter for supervision of other normal pregnancy, unspecified trimester: Secondary | ICD-10-CM

## 2022-02-12 DIAGNOSIS — Z3483 Encounter for supervision of other normal pregnancy, third trimester: Secondary | ICD-10-CM

## 2022-02-12 DIAGNOSIS — D563 Thalassemia minor: Secondary | ICD-10-CM

## 2022-02-12 NOTE — Progress Notes (Signed)
   PRENATAL VISIT NOTE  Subjective:  Loretta Bird is a 26 y.o. G3P0020 at 25w5dbeing seen today for ongoing prenatal care.  She is currently monitored for the following issues for this low-risk pregnancy and has Supervision of other normal pregnancy, antepartum; Rubella non-immune status, antepartum; and Alpha thalassemia silent carrier on their problem list.  Patient reports no complaints.  Contractions: Not present. Vag. Bleeding: None.  Movement: Present. Denies leaking of fluid.   The following portions of the patient's history were reviewed and updated as appropriate: allergies, current medications, past family history, past medical history, past social history, past surgical history and problem list.   Objective:   Vitals:   02/12/22 0831  BP: 108/67  Pulse: (!) 102  Weight: 166 lb (75.3 kg)    Fetal Status: Fetal Heart Rate (bpm): 139 Fundal Height: 29 cm Movement: Present     General:  Alert, oriented and cooperative. Patient is in no acute distress.  Skin: Skin is warm and dry. No rash noted.   Cardiovascular: Normal heart rate noted  Respiratory: Normal respiratory effort, no problems with respiration noted  Abdomen: Soft, gravid, appropriate for gestational age.  Pain/Pressure: Absent     Pelvic: Cervical exam deferred        Extremities: Normal range of motion.     Mental Status: Normal mood and affect. Normal behavior. Normal judgment and thought content.   Assessment and Plan:  Pregnancy: GK4M0102at 278w5d. Supervision of other normal pregnancy, antepartum Not sleeping well, may use H1 blocker if needed 28 wk labs today Offer TDaP and flu next visit.  2. Low lying placenta, antepartum resolved  3. Rubella non-immune status, antepartum MMR pp  Preterm labor symptoms and general obstetric precautions including but not limited to vaginal bleeding, contractions, leaking of fluid and fetal movement were reviewed in detail with the patient. Please refer to  After Visit Summary for other counseling recommendations.   Return in 2 weeks (on 02/26/2022).  Future Appointments  Date Time Provider [place]12/09/2021  3:30 PM AnOsborne OmanMD [facility]  03/12/2022  1:30 PM PiAletha HalimMD [facility]  03/26/2022  3:30 PM PrDonnamae JudeMD [facility]  04/09/2022  3:30 PM PrDonnamae JudeMD [facility]  04/15/2022  2:30 PM Anyanwu, UgSallyanne HaversMD [facility]  04/23/2022  3:30 PM PrDonnamae JudeMD [facility]    TaDonnamae JudeMD

## 2022-02-12 NOTE — Progress Notes (Signed)
Tdap offered, pt undecided.

## 2022-02-13 LAB — CBC
Hematocrit: 29.1 % — ABNORMAL LOW (ref 34.0–46.6)
Hemoglobin: 9.6 g/dL — ABNORMAL LOW (ref 11.1–15.9)
MCH: 27.7 pg (ref 26.6–33.0)
MCHC: 33 g/dL (ref 31.5–35.7)
MCV: 84 fL (ref 79–97)
Platelets: 290 10*3/uL (ref 150–450)
RBC: 3.47 x10E6/uL — ABNORMAL LOW (ref 3.77–5.28)
RDW: 12.3 % (ref 11.7–15.4)
WBC: 13.4 10*3/uL — ABNORMAL HIGH (ref 3.4–10.8)

## 2022-02-13 LAB — HIV ANTIBODY (ROUTINE TESTING W REFLEX): HIV Screen 4th Generation wRfx: NONREACTIVE

## 2022-02-13 LAB — RPR: RPR Ser Ql: NONREACTIVE

## 2022-02-13 LAB — GLUCOSE TOLERANCE, 2 HOURS W/ 1HR
Glucose, 1 hour: 132 mg/dL (ref 70–179)
Glucose, 2 hour: 123 mg/dL (ref 70–152)
Glucose, Fasting: 79 mg/dL (ref 70–91)

## 2022-02-14 MED ORDER — FERROUS SULFATE 325 (65 FE) MG PO TBEC: 325.0000 mg | DELAYED_RELEASE_TABLET | ORAL | 2 refills | Status: DC

## 2022-02-21 DIAGNOSIS — Z419 Encounter for procedure for purposes other than remedying health state, unspecified: Secondary | ICD-10-CM | POA: Diagnosis not present

## 2022-02-27 ENCOUNTER — Ambulatory Visit (INDEPENDENT_AMBULATORY_CARE_PROVIDER_SITE_OTHER): Payer: Medicaid Other | Admitting: Obstetrics & Gynecology

## 2022-02-27 VITALS — BP 108/69 | HR 106 | Wt 175.0 lb

## 2022-02-27 DIAGNOSIS — Z3A30 30 weeks gestation of pregnancy: Secondary | ICD-10-CM

## 2022-02-27 DIAGNOSIS — Z3480 Encounter for supervision of other normal pregnancy, unspecified trimester: Secondary | ICD-10-CM

## 2022-02-27 DIAGNOSIS — Z3483 Encounter for supervision of other normal pregnancy, third trimester: Secondary | ICD-10-CM

## 2022-02-27 DIAGNOSIS — Z23 Encounter for immunization: Secondary | ICD-10-CM | POA: Diagnosis not present

## 2022-02-27 NOTE — Progress Notes (Signed)
   PRENATAL VISIT NOTE  Subjective:  Loretta Bird is a 26 y.o. G3P0020 at [redacted]w[redacted]d being seen today for ongoing prenatal care.  She is currently monitored for the following issues for this low-risk pregnancy and has Supervision of other normal pregnancy, antepartum; Rubella non-immune status, antepartum; and Alpha thalassemia silent carrier on their problem list.  Patient reports no complaints.  Contractions: Not present. Vag. Bleeding: None.  Movement: Present. Denies leaking of fluid.   The following portions of the patient's history were reviewed and updated as appropriate: allergies, current medications, past family history, past medical history, past social history, past surgical history and problem list.   Objective:   Vitals:   02/27/22 1551  BP: 108/69  Pulse: (!) 106  Weight: 175 lb (79.4 kg)    Fetal Status: Fetal Heart Rate (bpm): 138 Fundal Height: 31 cm Movement: Present     General:  Alert, oriented and cooperative. Patient is in no acute distress.  Skin: Skin is warm and dry. No rash noted.   Cardiovascular: Normal heart rate noted  Respiratory: Normal respiratory effort, no problems with respiration noted  Abdomen: Soft, gravid, appropriate for gestational age.  Pain/Pressure: Absent     Pelvic: Cervical exam deferred        Extremities: Normal range of motion.  Edema: None  Mental Status: Normal mood and affect. Normal behavior. Normal judgment and thought content.   Assessment and Plan:  Pregnancy: G3P0020 at [redacted]w[redacted]d 1. Flu vaccine need - Flu Vaccine QUAD 36+ mos IM (Fluarix, Quad PF) given today  2. Need for diphtheria-tetanus-pertussis (Tdap) vaccine - Tdap vaccine greater than or equal to 7yo IM given today  3. [redacted] weeks gestation of pregnancy 4. Supervision of other normal pregnancy, antepartum Normal third trimester labs except for hemoglobin of 9.6, taking prescribed iron supplement. Preterm labor symptoms and general obstetric precautions including but  not limited to vaginal bleeding, contractions, leaking of fluid and fetal movement were reviewed in detail with the patient. Please refer to After Visit Summary for other counseling recommendations.   Return in about 2 weeks (around 03/13/2022) for OFFICE OB VISIT (MD or APP).  Future Appointments  Date Time Provider Department Center  03/12/2022  1:30 PM Moffat Bing, MD [facility]  03/26/2022  3:30 PM Reva Bores, MD [facility]  04/09/2022  3:30 PM Reva Bores, MD [facility]  04/15/2022  2:30 PM Macon Large, Jethro Bastos, MD [facility]  04/23/2022  3:30 PM Reva Bores, MD [facility]    Jaynie Collins, MD

## 2022-02-27 NOTE — Patient Instructions (Addendum)
Return to office for any scheduled appointments. Call the office or go to the MAU at [facility] at [facility] if: You begin to have strong, frequent contractions Your water breaks.  Sometimes it is a big gush of fluid, sometimes it is just a trickle that keeps getting your underwear wet or running down your legs You have vaginal bleeding.  It is normal to have a small amount of spotting if your cervix was checked.  You do not feel your baby moving like normal.  If you do not, get something to eat and drink and lay down and focus on feeling your baby move.   If your baby is still not moving like normal, you should call the office or go to MAU. Any other obstetric concerns.   Safe Medications in Pregnancy   Acne:  Benzoyl Peroxide  Salicylic Acid   Backache/Headache:  Tylenol: 2 regular strength every 4 hours OR               2 Extra strength every 6 hours   Colds/Coughs/Allergies:  Allegra Benadryl (alcohol free) 25 mg every 6 hours as needed  Breath right strips  Claritin  Cepacol throat lozenges  Chloraseptic throat spray  Cold-Eeze- up to three times per day  Cough drops, alcohol free  Flonase Guaifenesin  Mucinex (plain/DM) Nasacort Robitussin DM (plain only, alcohol free)  Saline nasal spray/drops  Steroid nasal sprays Sudafed (pseudoephedrine) & Actifed * use only after [redacted] weeks gestation and if you do not have high blood pressure  Tylenol  Vicks Vaporub  Zinc lozenges  Zyrtec   Make sure to not take anything that has the active ingredient Phenylephrine   Constipation:  Colace  Ducolax suppositories  Fleet enema  Glycerin suppositories  Metamucil  Milk of magnesia  Miralax  Senokot  Smooth move tea   Diarrhea:  Kaopectate  Imodium A-D   *NO pepto Bismol   Hemorrhoids:  Anusol  Anusol HC  Preparation H  Tucks   Indigestion:  Tums  Maalox  Mylanta  Zantac  Pepcid   Insomnia:  Benadryl (alcohol free) 25mg  every 6 hours as  needed  Tylenol PM  Unisom, no Gelcaps   Leg Cramps:  Tums  MagGel   Nausea/Vomiting:  Bonine  Dramamine  Emetrol  Ginger extract  Sea bands  Meclizine  Nausea medication to take during pregnancy:  Unisom (doxylamine succinate 25 mg tablets) Take one tablet daily at bedtime. If symptoms are not adequately controlled, the dose can be increased to a maximum recommended dose of two tablets daily (1/2 tablet in the morning, 1/2 tablet mid-afternoon and one at bedtime).  Vitamin B6 100mg  tablets. Take one tablet twice a day (up to 200 mg per day).   Skin Rashes:  Aveeno products  Benadryl cream or 25mg  every 6 hours as needed  Calamine Lotion  1% cortisone cream   Yeast infection:  Gyne-lotrimin 7  Monistat 7    **If taking multiple medications, please check labels to avoid duplicating the same active ingredients  **take medication as directed on the label  ** Do not exceed 4000 mg of tylenol in 24 hours  **Do not take medications that contain aspirin or ibuprofen

## 2022-03-12 ENCOUNTER — Ambulatory Visit (INDEPENDENT_AMBULATORY_CARE_PROVIDER_SITE_OTHER): Payer: Medicaid Other | Admitting: Obstetrics and Gynecology

## 2022-03-12 ENCOUNTER — Encounter: Payer: Self-pay | Admitting: Obstetrics and Gynecology

## 2022-03-12 VITALS — BP 107/72 | HR 108 | Wt 180.6 lb

## 2022-03-12 DIAGNOSIS — O99013 Anemia complicating pregnancy, third trimester: Secondary | ICD-10-CM | POA: Insufficient documentation

## 2022-03-12 DIAGNOSIS — Z3A32 32 weeks gestation of pregnancy: Secondary | ICD-10-CM

## 2022-03-12 NOTE — Progress Notes (Signed)
   PRENATAL VISIT NOTE  Subjective:  Loretta Bird is a 26 y.o. G3P0020 at [redacted]w[redacted]d being seen today for ongoing prenatal care.  She is currently monitored for the following issues for this low-risk pregnancy and has Supervision of other normal pregnancy, antepartum; Rubella non-immune status, antepartum; and Alpha thalassemia silent carrier on their problem list.  Patient reports no complaints.  Contractions: Irritability. Vag. Bleeding: None.  Movement: Present. Denies leaking of fluid.   The following portions of the patient's history were reviewed and updated as appropriate: allergies, current medications, past family history, past medical history, past social history, past surgical history and problem list.   Objective:   Vitals:   03/12/22 1400  BP: 107/72  Pulse: (!) 108  Weight: 180 lb 9.6 oz (81.9 kg)    Fetal Status: Fetal Heart Rate (bpm): 140 Fundal Height: 32 cm Movement: Present     General:  Alert, oriented and cooperative. Patient is in no acute distress.  Skin: Skin is warm and dry. No rash noted.   Cardiovascular: Normal heart rate noted  Respiratory: Normal respiratory effort, no problems with respiration noted  Abdomen: Soft, gravid, appropriate for gestational age.  Pain/Pressure: Absent     Pelvic: Cervical exam deferred        Extremities: Normal range of motion.  Edema: None  Mental Status: Normal mood and affect. Normal behavior. Normal judgment and thought content.   Assessment and Plan:  Pregnancy: G3P0020 at [redacted]w[redacted]d 1. [redacted] weeks gestation of pregnancy Routine care. D/w pt more re: birth control next visit.   2. Anemia in pregnancy Confirms on po iron but with some SEs. Recommend doing qod and try taking with vitamin c. Consider anemia panel next visit     Latest Ref Rng & Units 02/12/2022    9:41 AM 11/02/2021    6:53 AM 10/08/2021    2:09 PM  CBC  WBC 3.4 - 10.8 x10E3/uL 13.4  10.6  12.0   Hemoglobin 11.1 - 15.9 g/dL 9.6  68.6  16.8   Hematocrit  34.0 - 46.6 % 29.1  30.6  32.8   Platelets 150 - 450 x10E3/uL 290  242  255    Preterm labor symptoms and general obstetric precautions including but not limited to vaginal bleeding, contractions, leaking of fluid and fetal movement were reviewed in detail with the patient. Please refer to After Visit Summary for other counseling recommendations.   No follow-ups on file.  Future Appointments  Date Time Provider Department Center  03/26/2022  3:30 PM Reva Bores, MD [facility]  04/09/2022  3:30 PM Reva Bores, MD [facility]  04/15/2022  2:30 PM Macon Large, Jethro Bastos, MD [facility]  04/23/2022  3:30 PM Reva Bores, MD [facility]    Lyle Bing, MD

## 2022-03-24 DIAGNOSIS — Z419 Encounter for procedure for purposes other than remedying health state, unspecified: Secondary | ICD-10-CM | POA: Diagnosis not present

## 2022-03-24 NOTE — L&D Delivery Note (Signed)
OB/[facility]N Faculty Practice Delivery Note  Loretta Bird is a 27 y.o. G3P0020 s/p VD at [redacted]w[redacted]d She was admitted for SOL with DFM.   ROM: 23h 529mith clear fluid GBS Status: Negative Maximum Maternal Temperature: 100.8  Labor Progress: Patient augmented d/t DFM.  She was diagnosed with Triple I and treated with Gent/Amp.  She progressed to complete and delivered as below with staff and family support.   Delivery Date/Time: Wednesday May 07, 2022 at 0950 Delivery: Called to room and patient reporting pressure. Provider dressed for delivery and patient encouraged to push as perceived. Head delivered in Direct OA in absence of nuchal cord. Shoulder and body delivered in usual fashion. Infant with  minimal cry, but good tone and heart rate.  Tactile stimulation given by provider and infant then placed on mother's abdomen, where nurses continued to dry and stimulate. Cord clamped x 2 after 1-minute delay, and cut by FOB. Cord blood drawn. Pitocin started and after 12 minutes placenta delivered spontaneously, via shultz, with gentle cord traction.Vaginal, perineum, labial, and cervical inspection revealed a right labial laceration that was repaired with 3-0 vicryl on SH.   No additional anesthetic necessary and patient tolerated the procedure well. Cervix noted at introitus and with questionable laceration vs swelling.  Dr. N.Imagene Richesalled to bedside and assessed with no findings requiring intervention. Fundus firm, at the umbilicus, and bleeding small.  Mother hemodynamically stable and infant skin to skin, with father, prior to provider exit.  Mother desires outpatient IUD for birth control and opts to breastfeed.  Family wishes for infant to be circumcised during inpatient stay and verbal circumcision consent obtained.   Placenta: Intact, Disposal Complications: Triple I, Maternal Fever Lacerations: Right Labial-Repaired EBL: 113 Analgesia: Epidural  Postpartum Planning Message for PPV  Sent Circumcision Consent  Discharge Summary Shared  Infant: Female-Loretta Bird  APGARs 7, 9  4280g, 9lb 7oz, 21.75in  JeMaryann ConnersCNM  05/07/2022 10:59 AM

## 2022-03-26 ENCOUNTER — Ambulatory Visit (INDEPENDENT_AMBULATORY_CARE_PROVIDER_SITE_OTHER): Payer: Medicaid Other | Admitting: Family Medicine

## 2022-03-26 VITALS — BP 104/69 | HR 112 | Wt 182.0 lb

## 2022-03-26 DIAGNOSIS — O09893 Supervision of other high risk pregnancies, third trimester: Secondary | ICD-10-CM

## 2022-03-26 DIAGNOSIS — Z3A34 34 weeks gestation of pregnancy: Secondary | ICD-10-CM

## 2022-03-26 DIAGNOSIS — Z3480 Encounter for supervision of other normal pregnancy, unspecified trimester: Secondary | ICD-10-CM

## 2022-03-26 DIAGNOSIS — Z2839 Other underimmunization status: Secondary | ICD-10-CM

## 2022-03-26 DIAGNOSIS — O09899 Supervision of other high risk pregnancies, unspecified trimester: Secondary | ICD-10-CM

## 2022-03-26 NOTE — Progress Notes (Signed)
   PRENATAL VISIT NOTE  Subjective:  Loretta Bird is a 27 y.o. G3P0020 at 42w5dbeing seen today for ongoing prenatal care.  She is currently monitored for the following issues for this low-risk pregnancy and has Supervision of other normal pregnancy, antepartum; Rubella non-immune status, antepartum; Alpha thalassemia silent carrier; and Anemia during pregnancy in third trimester on their problem list.  Patient reports no complaints.  Contractions: Irritability. Vag. Bleeding: None.  Movement: Present. Denies leaking of fluid.   The following portions of the patient's history were reviewed and updated as appropriate: allergies, current medications, past family history, past medical history, past social history, past surgical history and problem list.   Objective:   Vitals:   03/26/22 1551  BP: 104/69  Pulse: (!) 112  Weight: 182 lb (82.6 kg)    Fetal Status: Fetal Heart Rate (bpm): 144 Fundal Height: 33 cm Movement: Present  Presentation: Vertex  General:  Alert, oriented and cooperative. Patient is in no acute distress.  Skin: Skin is warm and dry. No rash noted.   Cardiovascular: Normal heart rate noted  Respiratory: Normal respiratory effort, no problems with respiration noted  Abdomen: Soft, gravid, appropriate for gestational age.  Pain/Pressure: Present     Pelvic: Cervical exam deferred        Extremities: Normal range of motion.  Edema: None  Mental Status: Normal mood and affect. Normal behavior. Normal judgment and thought content.   Assessment and Plan:  Pregnancy: [id number] 363w5d. Supervision of other normal pregnancy, antepartum Continue routine prenatal care.  2. Rubella non-immune status, antepartum MMR pp  Preterm labor symptoms and general obstetric precautions including but not limited to vaginal bleeding, contractions, leaking of fluid and fetal movement were reviewed in detail with the patient. Please refer to After Visit Summary for other  counseling recommendations.   Return in 2 weeks (on 04/09/2022).  Future Appointments  Date Time Provider Department Center  04/09/2022  3:30 PM PrDonnamae JudeMD [facility]  04/15/2022  2:30 PM Anyanwu, UgSallyanne HaversMD [facility]  04/23/2022  3:30 PM PrDonnamae JudeMD [facility]  04/30/2022  3:30 PM NeCaren MacadamMD [facility]    TaDonnamae JudeMD

## 2022-03-26 NOTE — Progress Notes (Signed)
ROB   CC: None    

## 2022-04-09 ENCOUNTER — Ambulatory Visit (INDEPENDENT_AMBULATORY_CARE_PROVIDER_SITE_OTHER): Payer: Medicaid Other | Admitting: Family Medicine

## 2022-04-09 ENCOUNTER — Other Ambulatory Visit (HOSPITAL_COMMUNITY)
Admission: RE | Admit: 2022-04-09 | Discharge: 2022-04-09 | Disposition: A | Discharge status: Home / Self Care | Payer: Medicaid Other | Source: Ambulatory Visit | Attending: Family Medicine | Admitting: Family Medicine

## 2022-04-09 VITALS — BP 105/65 | HR 99 | Wt 187.0 lb

## 2022-04-09 DIAGNOSIS — Z3480 Encounter for supervision of other normal pregnancy, unspecified trimester: Secondary | ICD-10-CM

## 2022-04-09 DIAGNOSIS — O09899 Supervision of other high risk pregnancies, unspecified trimester: Secondary | ICD-10-CM

## 2022-04-09 DIAGNOSIS — O99013 Anemia complicating pregnancy, third trimester: Secondary | ICD-10-CM

## 2022-04-09 DIAGNOSIS — O09893 Supervision of other high risk pregnancies, third trimester: Secondary | ICD-10-CM

## 2022-04-09 DIAGNOSIS — Z3A36 36 weeks gestation of pregnancy: Secondary | ICD-10-CM

## 2022-04-09 NOTE — Progress Notes (Signed)
   PRENATAL VISIT NOTE  Subjective:  Loretta Bird is a 27 y.o. G3P0020 at [redacted]w[redacted]d being seen today for ongoing prenatal care.  She is currently monitored for the following issues for this low-risk pregnancy and has Supervision of other normal pregnancy, antepartum; Rubella non-immune status, antepartum; Alpha thalassemia silent carrier; and Anemia during pregnancy in third trimester on their problem list.  Patient reports no complaints.  Contractions: Irritability. Vag. Bleeding: None.  Movement: Present. Denies leaking of fluid.   The following portions of the patient's history were reviewed and updated as appropriate: allergies, current medications, past family history, past medical history, past social history, past surgical history and problem list.   Objective:   Vitals:   04/09/22 1554  BP: 105/65  Pulse: 99  Weight: 187 lb (84.8 kg)    Fetal Status: Fetal Heart Rate (bpm): 145 Fundal Height: 35 cm Movement: Present  Presentation: Vertex  General:  Alert, oriented and cooperative. Patient is in no acute distress.  Skin: Skin is warm and dry. No rash noted.   Cardiovascular: Normal heart rate noted  Respiratory: Normal respiratory effort, no problems with respiration noted  Abdomen: Soft, gravid, appropriate for gestational age.  Pain/Pressure: Present     Pelvic: Cervical exam performed in the presence of a chaperone Dilation: Closed Effacement (%): 10, 20 Station: -2, -3  Extremities: Normal range of motion.  Edema: Trace  Mental Status: Normal mood and affect. Normal behavior. Normal judgment and thought content.   Assessment and Plan:  Pregnancy: [id number] at [redacted]w[redacted]d 1. Rubella non-immune status, antepartum Needs MMR pp  2. Supervision of other normal pregnancy, antepartum Cultures today - Strep Gp B NAA - Cervicovaginal ancillary only( [place])  3. Anemia during pregnancy in third trimester On Iron  Preterm labor symptoms and general obstetric precautions  including but not limited to vaginal bleeding, contractions, leaking of fluid and fetal movement were reviewed in detail with the patient. Please refer to After Visit Summary for other counseling recommendations.   Return in 1 week (on 04/16/2022).  Future Appointments  Date Time Provider Department Center  04/15/2022  2:30 PM Osborne Oman, MD [facility]  04/23/2022  3:30 PM Donnamae Jude, MD [facility]  04/30/2022  3:30 PM Caren Macadam, MD [facility]    Donnamae Jude, MD

## 2022-04-09 NOTE — Progress Notes (Signed)
ROB   Pt would like cervix check.

## 2022-04-11 LAB — CERVICOVAGINAL ANCILLARY ONLY
Chlamydia: NEGATIVE
Comment: NEGATIVE
Comment: NORMAL
Neisseria Gonorrhea: NEGATIVE

## 2022-04-11 LAB — STREP GP B NAA: Strep Gp B NAA: NEGATIVE

## 2022-04-15 ENCOUNTER — Encounter: Payer: Self-pay | Admitting: Obstetrics & Gynecology

## 2022-04-15 ENCOUNTER — Ambulatory Visit (INDEPENDENT_AMBULATORY_CARE_PROVIDER_SITE_OTHER): Payer: Medicaid Other | Admitting: Obstetrics & Gynecology

## 2022-04-15 VITALS — BP 115/71 | HR 108 | Wt 188.2 lb

## 2022-04-15 DIAGNOSIS — K219 Gastro-esophageal reflux disease without esophagitis: Secondary | ICD-10-CM

## 2022-04-15 DIAGNOSIS — O99613 Diseases of the digestive system complicating pregnancy, third trimester: Secondary | ICD-10-CM

## 2022-04-15 DIAGNOSIS — Z3A37 37 weeks gestation of pregnancy: Secondary | ICD-10-CM

## 2022-04-15 DIAGNOSIS — O99353 Diseases of the nervous system complicating pregnancy, third trimester: Secondary | ICD-10-CM

## 2022-04-15 DIAGNOSIS — Z3480 Encounter for supervision of other normal pregnancy, unspecified trimester: Secondary | ICD-10-CM

## 2022-04-15 NOTE — Patient Instructions (Signed)
Return to office for any scheduled appointments. Call the office or go to the MAU at [facility] [place] if: You begin to have strong, frequent contractions Your water breaks.  Sometimes it is a big gush of fluid, sometimes it is just a trickle that keeps getting your underwear wet or running down your legs You have vaginal bleeding.  It is normal to have a small amount of spotting if your cervix was checked.  You do not feel your baby moving like normal.  If you do not, get something to eat and drink and lay down and focus on feeling your baby move.   If your baby is still not moving like normal, you should call the office or go to MAU. Any other obstetric concerns.  

## 2022-04-15 NOTE — Progress Notes (Signed)
Pt presents for ROB reports btl wrist pain.

## 2022-04-15 NOTE — Progress Notes (Signed)
   PRENATAL VISIT NOTE  Subjective:  Loretta Bird is a 27 y.o. G3P0020 at [redacted]w[redacted]d being seen today for ongoing prenatal care.  She is currently monitored for the following issues for this low-risk pregnancy and has Supervision of other normal pregnancy, antepartum; Rubella non-immune status, antepartum; Alpha thalassemia silent carrier; and Anemia during pregnancy in third trimester on their problem list.  Patient reports carpal tunnel symptoms and heartburn.  Heartburn alleviated by milk and Tums.  Contractions: Not present. Vag. Bleeding: None.  Movement: Present. Denies leaking of fluid.   The following portions of the patient's history were reviewed and updated as appropriate: allergies, current medications, past family history, past medical history, past social history, past surgical history and problem list.   Objective:   Vitals:   04/15/22 1442  BP: 115/71  Pulse: (!) 108  Weight: 188 lb 3.2 oz (85.4 kg)    Fetal Status: Fetal Heart Rate (bpm): 145   Movement: Present     General:  Alert, oriented and cooperative. Patient is in no acute distress.  Skin: Skin is warm and dry. No rash noted.   Cardiovascular: Normal heart rate noted  Respiratory: Normal respiratory effort, no problems with respiration noted  Abdomen: Soft, gravid, appropriate for gestational age.  Pain/Pressure: Present     Pelvic: Cervical exam deferred        Extremities: Normal range of motion.  Edema: Trace  Mental Status: Normal mood and affect. Normal behavior. Normal judgment and thought content.   Results for orders placed or performed in visit on 04/09/22 (from the past 336 hour(s))  Strep Gp B NAA   Collection Time: 04/09/22  4:00 PM   Specimen: Vaginal/Rectal; Genital   VR  Result Value Ref Range   Strep Gp B NAA Negative Negative  Cervicovaginal ancillary only( [place])   Collection Time: 04/09/22  4:14 PM  Result Value Ref Range   Chlamydia Negative    Neisseria Gonorrhea Negative     Comment Normal Reference [place] Chlamydia - Negative    Comment      Normal Reference Range Neisseria Gonorrhea - Negative    Assessment and Plan:  Pregnancy: G3P0020 at [redacted]w[redacted]d 1. Gastroesophageal reflux in pregnancy in third trimester Continue Tums for now, also informed that she can be on Pepcid or Protonix if desired.  2. Pregnancy related carpal tunnel syndrome in third trimester Discussed that this can occur in pregnancy, recommended wrist support that can help.  3. [redacted] weeks gestation of pregnancy 4. Supervision of other normal pregnancy, antepartum Negative pelvic cultures.  Labor symptoms and general obstetric precautions including but not limited to vaginal bleeding, contractions, leaking of fluid and fetal movement were reviewed in detail with the patient. Please refer to After Visit Summary for other counseling recommendations.   Return in about 1 week (around 04/22/2022) for OFFICE OB VISIT (MD or APP).  Future Appointments  Date Time Provider Department Center  04/23/2022  3:30 PM Donnamae Jude, MD [facility]  04/30/2022  3:30 PM Caren Macadam, MD [facility]    Verita Schneiders, MD

## 2022-04-23 ENCOUNTER — Ambulatory Visit (INDEPENDENT_AMBULATORY_CARE_PROVIDER_SITE_OTHER): Payer: Medicaid Other | Admitting: Family Medicine

## 2022-04-23 VITALS — BP 108/70 | HR 99 | Wt 192.4 lb

## 2022-04-23 DIAGNOSIS — Z2839 Other underimmunization status: Secondary | ICD-10-CM

## 2022-04-23 DIAGNOSIS — Z3A38 38 weeks gestation of pregnancy: Secondary | ICD-10-CM

## 2022-04-23 DIAGNOSIS — Z3480 Encounter for supervision of other normal pregnancy, unspecified trimester: Secondary | ICD-10-CM

## 2022-04-23 DIAGNOSIS — O99013 Anemia complicating pregnancy, third trimester: Secondary | ICD-10-CM

## 2022-04-23 NOTE — Progress Notes (Signed)
CC: Routine Prenatal  Does want Cervix checked today

## 2022-04-23 NOTE — Progress Notes (Signed)
   PRENATAL VISIT NOTE  Subjective:  Loretta Bird is a 27 y.o. G3P0020 at [redacted]w[redacted]d being seen today for ongoing prenatal care.  She is currently monitored for the following issues for this low-risk pregnancy and has Supervision of other normal pregnancy, antepartum; Rubella non-immune status, antepartum; Alpha thalassemia silent carrier; and Anemia during pregnancy in third trimester on their problem list.  Patient reports no complaints.  Contractions: Not present. Vag. Bleeding: None.  Movement: Present. Denies leaking of fluid.   The following portions of the patient's history were reviewed and updated as appropriate: allergies, current medications, past family history, past medical history, past social history, past surgical history and problem list.   Objective:   Vitals:   04/23/22 1535  BP: 108/70  Pulse: 99  Weight: 192 lb 6.4 oz (87.3 kg)    Fetal Status: Fetal Heart Rate (bpm): 136 Fundal Height: 37 cm Movement: Present  Presentation: Vertex  General:  Alert, oriented and cooperative. Patient is in no acute distress.  Skin: Skin is warm and dry. No rash noted.   Cardiovascular: Normal heart rate noted  Respiratory: Normal respiratory effort, no problems with respiration noted  Abdomen: Soft, gravid, appropriate for gestational age.  Pain/Pressure: Absent     Pelvic: Cervical exam performed in the presence of a chaperone Dilation: 1 Effacement (%): 50 Station: -2, -3  Extremities: Normal range of motion.  Edema: Trace  Mental Status: Normal mood and affect. Normal behavior. Normal judgment and thought content.   Assessment and Plan:  Pregnancy: [id number] at [redacted]w[redacted]d 1. Supervision of other normal pregnancy, antepartum Continue routine prenatal care.  2. Rubella non-immune status, antepartum MMR pp  3. Anemia during pregnancy in third trimester On iron qod  Term labor symptoms and general obstetric precautions including but not limited to vaginal bleeding, contractions,  leaking of fluid and fetal movement were reviewed in detail with the patient. Please refer to After Visit Summary for other counseling recommendations.   Return in 1 week (on 04/30/2022).  Future Appointments  Date Time Provider Department Center  04/30/2022  3:30 PM Caren Macadam, MD [facility]    Donnamae Jude, MD

## 2022-04-24 DIAGNOSIS — Z419 Encounter for procedure for purposes other than remedying health state, unspecified: Secondary | ICD-10-CM | POA: Diagnosis not present

## 2022-04-28 ENCOUNTER — Other Ambulatory Visit (HOSPITAL_COMMUNITY): Payer: Self-pay | Admitting: Advanced Practice Midwife

## 2022-04-30 ENCOUNTER — Encounter: Payer: Self-pay | Admitting: Family Medicine

## 2022-04-30 ENCOUNTER — Ambulatory Visit (INDEPENDENT_AMBULATORY_CARE_PROVIDER_SITE_OTHER): Payer: Medicaid Other | Admitting: Family Medicine

## 2022-04-30 VITALS — BP 110/73 | HR 96 | Wt 195.0 lb

## 2022-04-30 DIAGNOSIS — Z3A39 39 weeks gestation of pregnancy: Secondary | ICD-10-CM

## 2022-04-30 DIAGNOSIS — O26893 Other specified pregnancy related conditions, third trimester: Secondary | ICD-10-CM

## 2022-04-30 DIAGNOSIS — R300 Dysuria: Secondary | ICD-10-CM

## 2022-04-30 DIAGNOSIS — Z3480 Encounter for supervision of other normal pregnancy, unspecified trimester: Secondary | ICD-10-CM

## 2022-04-30 LAB — POCT URINALYSIS DIPSTICK
Bilirubin, UA: NEGATIVE
Glucose, UA: NEGATIVE
Ketones, UA: NEGATIVE
Leukocytes, UA: NEGATIVE
Nitrite, UA: NEGATIVE
Protein, UA: POSITIVE — AB
Spec Grav, UA: 1.02 (ref 1.010–1.025)
Urobilinogen, UA: 0.2 E.U./dL
pH, UA: 6 (ref 5.0–8.0)

## 2022-04-30 NOTE — Progress Notes (Signed)
ROB   CC: Carpel Tunnel in both wrist   Pt wants cervix check and Membranes swept Notes jelly like discharge and possible UTI.

## 2022-04-30 NOTE — Patient Instructions (Signed)
[email or web address]   Please go to this website a do the daily stretches.  These can help  Consider being on hands and knees and doing pelvic tilts to help your baby change their position.

## 2022-04-30 NOTE — Progress Notes (Signed)
   PRENATAL VISIT NOTE  Subjective:  Loretta Bird is a 27 y.o. G3P0020 at [redacted]w[redacted]d being seen today for ongoing prenatal care.  She is currently monitored for the following issues for this low-risk pregnancy and has Supervision of other normal pregnancy, antepartum; Rubella non-immune status, antepartum; Alpha thalassemia silent carrier; and Anemia during pregnancy in third trimester on their problem list.  Patient reports no complaints.  Contractions: Irritability. Vag. Bleeding: None.  Movement: Present. Denies leaking of fluid.   The following portions of the patient's history were reviewed and updated as appropriate: allergies, current medications, past family history, past medical history, past social history, past surgical history and problem list.   Objective:   Vitals:   04/30/22 1539  BP: 110/73  Pulse: 96  Weight: 195 lb (88.5 kg)    Fetal Status: Fetal Heart Rate (bpm): 156 Fundal Height: 39 cm Movement: Present  Presentation: Vertex  General:  Alert, oriented and cooperative. Patient is in no acute distress.  Skin: Skin is warm and dry. No rash noted.   Cardiovascular: Normal heart rate noted  Respiratory: Normal respiratory effort, no problems with respiration noted  Abdomen: Soft, gravid, appropriate for gestational age.  Pain/Pressure: Present     Pelvic: Cervical exam deferred Dilation: 2 Effacement (%): 50 Station: -3 (Feels like infant needs to rotate and straighten to apply head to cervix better)  Extremities: Normal range of motion.  Edema: Mild pitting, slight indentation  Mental Status: Normal mood and affect. Normal behavior. Normal judgment and thought content.   Assessment and Plan:  Pregnancy: [id number] at [redacted]w[redacted]d 1. Supervision of other normal pregnancy, antepartum UTD Vigorous movement Using the birth ball to help  Discussed membrane sweeping today. Reviewed cochrane review data on membrane sweeping at 39 wks and then at EDD. Reviewed risk of cramping,  contractions, bleeding and ROM. Answered patient questions and she agreed to proceed with procedure.  Patient only allow 1 swept and became nauseated. She declined additional sweeping.    2. Dysuria during pregnancy in third trimester Sent urine for culture - POCT Urinalysis Dipstick  Preterm labor symptoms and general obstetric precautions including but not limited to vaginal bleeding, contractions, leaking of fluid and fetal movement were reviewed in detail with the patient. Please refer to After Visit Summary for other counseling recommendations.   Return in about 1 week (around 05/07/2022) for induction.  Future Appointments  Date Time Provider [place]  05/09/2022  7:00 AM [facility] [place] None    Caren Macadam, MD

## 2022-05-02 ENCOUNTER — Telehealth (HOSPITAL_COMMUNITY): Payer: Self-pay | Admitting: *Deleted

## 2022-05-02 ENCOUNTER — Encounter (HOSPITAL_COMMUNITY): Payer: Self-pay | Admitting: *Deleted

## 2022-05-02 NOTE — Telephone Encounter (Signed)
Preadmission screen  

## 2022-05-04 ENCOUNTER — Other Ambulatory Visit: Payer: Self-pay | Admitting: Advanced Practice Midwife

## 2022-05-06 ENCOUNTER — Other Ambulatory Visit: Payer: Self-pay

## 2022-05-06 ENCOUNTER — Encounter (HOSPITAL_COMMUNITY): Payer: Self-pay | Admitting: Obstetrics & Gynecology

## 2022-05-06 ENCOUNTER — Inpatient Hospital Stay (HOSPITAL_COMMUNITY): Payer: Medicaid Other | Admitting: Anesthesiology

## 2022-05-06 ENCOUNTER — Inpatient Hospital Stay (HOSPITAL_COMMUNITY)
Admission: AD | Admit: 2022-05-06 | Discharge: 2022-05-09 | DRG: 805 | Disposition: A | Discharge status: Home / Self Care | Payer: Medicaid Other | Attending: Obstetrics and Gynecology | Admitting: Obstetrics and Gynecology

## 2022-05-06 DIAGNOSIS — O480 Post-term pregnancy: Secondary | ICD-10-CM | POA: Diagnosis present

## 2022-05-06 DIAGNOSIS — O9902 Anemia complicating childbirth: Secondary | ICD-10-CM | POA: Diagnosis present

## 2022-05-06 DIAGNOSIS — O368130 Decreased fetal movements, third trimester, not applicable or unspecified: Secondary | ICD-10-CM | POA: Diagnosis not present

## 2022-05-06 DIAGNOSIS — Z3480 Encounter for supervision of other normal pregnancy, unspecified trimester: Secondary | ICD-10-CM

## 2022-05-06 DIAGNOSIS — D563 Thalassemia minor: Secondary | ICD-10-CM | POA: Diagnosis present

## 2022-05-06 DIAGNOSIS — O411230 Chorioamnionitis, third trimester, not applicable or unspecified: Secondary | ICD-10-CM | POA: Diagnosis not present

## 2022-05-06 DIAGNOSIS — Z2839 Other underimmunization status: Secondary | ICD-10-CM

## 2022-05-06 DIAGNOSIS — O99013 Anemia complicating pregnancy, third trimester: Secondary | ICD-10-CM | POA: Diagnosis present

## 2022-05-06 DIAGNOSIS — Z3A40 40 weeks gestation of pregnancy: Secondary | ICD-10-CM

## 2022-05-06 DIAGNOSIS — D649 Anemia, unspecified: Secondary | ICD-10-CM | POA: Diagnosis not present

## 2022-05-06 DIAGNOSIS — O09899 Supervision of other high risk pregnancies, unspecified trimester: Secondary | ICD-10-CM

## 2022-05-06 DIAGNOSIS — Z148 Genetic carrier of other disease: Secondary | ICD-10-CM

## 2022-05-06 DIAGNOSIS — O99214 Obesity complicating childbirth: Secondary | ICD-10-CM | POA: Diagnosis present

## 2022-05-06 LAB — CBC
HCT: 27.4 % — ABNORMAL LOW (ref 36.0–46.0)
Hemoglobin: 8.6 g/dL — ABNORMAL LOW (ref 12.0–15.0)
MCH: 24.2 pg — ABNORMAL LOW (ref 26.0–34.0)
MCHC: 31.4 g/dL (ref 30.0–36.0)
MCV: 77 fL — ABNORMAL LOW (ref 80.0–100.0)
Platelets: 321 10*3/uL (ref 150–400)
RBC: 3.56 MIL/uL — ABNORMAL LOW (ref 3.87–5.11)
RDW: 14.9 % (ref 11.5–15.5)
WBC: 14.1 10*3/uL — ABNORMAL HIGH (ref 4.0–10.5)
nRBC: 0 % (ref 0.0–0.2)

## 2022-05-06 LAB — RPR: RPR Ser Ql: NONREACTIVE

## 2022-05-06 LAB — AMNISURE RUPTURE OF MEMBRANE (ROM) NOT AT ARMC: Amnisure ROM: NEGATIVE

## 2022-05-06 LAB — POCT FERN TEST: POCT Fern Test: NEGATIVE

## 2022-05-06 MED ORDER — MISOPROSTOL 25 MCG QUARTER TABLET: 25.0000 ug | ORAL_TABLET | Freq: Once | ORAL | Status: DC

## 2022-05-06 MED ORDER — FLEET ENEMA 7-19 GM/118ML RE ENEM: 1.0000 | ENEMA | RECTAL | Status: DC | PRN

## 2022-05-06 MED ORDER — GENTAMICIN SULFATE 40 MG/ML IJ SOLN
5.0000 mg/kg | INTRAVENOUS | Status: DC
  Filled 2022-05-06: qty 11.25

## 2022-05-06 MED ORDER — FENTANYL-BUPIVACAINE-NACL 0.5-0.125-0.9 MG/250ML-% EP SOLN
12.0000 mL/h | EPIDURAL | Status: DC | PRN
  Administered 2022-05-06: 12 mL/h via EPIDURAL
  Filled 2022-05-06 (×2): qty 250

## 2022-05-06 MED ORDER — ACETAMINOPHEN 500 MG PO TABS
1000.0000 mg | ORAL_TABLET | Freq: Once | ORAL | Status: AC
  Administered 2022-05-06: 1000 mg via ORAL
  Filled 2022-05-06: qty 2

## 2022-05-06 MED ORDER — LIDOCAINE HCL (PF) 1 % IJ SOLN
INTRAMUSCULAR | Status: DC | PRN
  Administered 2022-05-06: 5 mL via EPIDURAL
  Administered 2022-05-06: 3 mL via EPIDURAL
  Administered 2022-05-06: 2 mL via EPIDURAL

## 2022-05-06 MED ORDER — ACETAMINOPHEN 325 MG PO TABS: 650.0000 mg | ORAL_TABLET | ORAL | Status: DC | PRN

## 2022-05-06 MED ORDER — LACTATED RINGERS IV SOLN: 500.0000 mL | Freq: Once | INTRAVENOUS | Status: DC

## 2022-05-06 MED ORDER — LACTATED RINGERS IV SOLN: 500.0000 mL | INTRAVENOUS | Status: DC | PRN

## 2022-05-06 MED ORDER — ONDANSETRON HCL 4 MG/2ML IJ SOLN: 4.0000 mg | Freq: Four times a day (QID) | INTRAMUSCULAR | Status: DC | PRN

## 2022-05-06 MED ORDER — MISOPROSTOL 50MCG HALF TABLET: 50.0000 ug | ORAL_TABLET | Freq: Once | ORAL | Status: DC

## 2022-05-06 MED ORDER — GENTAMICIN SULFATE 40 MG/ML IJ SOLN
5.0000 mg/kg | INTRAVENOUS | Status: DC
  Administered 2022-05-07: 340 mg via INTRAVENOUS
  Filled 2022-05-06: qty 8.5

## 2022-05-06 MED ORDER — EPHEDRINE 5 MG/ML INJ: 10.0000 mg | INTRAVENOUS | Status: DC | PRN

## 2022-05-06 MED ORDER — PHENYLEPHRINE 80 MCG/ML (10ML) SYRINGE FOR IV PUSH (FOR BLOOD PRESSURE SUPPORT)
80.0000 ug | PREFILLED_SYRINGE | INTRAVENOUS | Status: DC | PRN
  Filled 2022-05-06: qty 10

## 2022-05-06 MED ORDER — TERBUTALINE SULFATE 1 MG/ML IJ SOLN: 0.2500 mg | Freq: Once | INTRAMUSCULAR | Status: DC | PRN

## 2022-05-06 MED ORDER — OXYTOCIN-SODIUM CHLORIDE 30-0.9 UT/500ML-% IV SOLN
1.0000 m[IU]/min | INTRAVENOUS | Status: DC
  Administered 2022-05-06: 2 m[IU]/min via INTRAVENOUS
  Filled 2022-05-06: qty 500

## 2022-05-06 MED ORDER — DIPHENHYDRAMINE HCL 50 MG/ML IJ SOLN: 12.5000 mg | INTRAMUSCULAR | Status: DC | PRN

## 2022-05-06 MED ORDER — FENTANYL CITRATE (PF) 100 MCG/2ML IJ SOLN
INTRAMUSCULAR | Status: DC | PRN
  Administered 2022-05-06: 100 ug via EPIDURAL

## 2022-05-06 MED ORDER — LACTATED RINGERS IV SOLN: INTRAVENOUS | Status: DC

## 2022-05-06 MED ORDER — FENTANYL CITRATE (PF) 100 MCG/2ML IJ SOLN
INTRAMUSCULAR | Status: AC
  Filled 2022-05-06: qty 2

## 2022-05-06 MED ORDER — OXYCODONE-ACETAMINOPHEN 5-325 MG PO TABS: 1.0000 | ORAL_TABLET | ORAL | Status: DC | PRN

## 2022-05-06 MED ORDER — OXYTOCIN-SODIUM CHLORIDE 30-0.9 UT/500ML-% IV SOLN
2.5000 [IU]/h | INTRAVENOUS | Status: DC
  Filled 2022-05-06: qty 500

## 2022-05-06 MED ORDER — BUPIVACAINE HCL (PF) 0.25 % IJ SOLN
INTRAMUSCULAR | Status: DC | PRN
  Administered 2022-05-06: 8 mL via EPIDURAL

## 2022-05-06 MED ORDER — LIDOCAINE HCL (PF) 1 % IJ SOLN: 30.0000 mL | INTRAMUSCULAR | Status: DC | PRN

## 2022-05-06 MED ORDER — SOD CITRATE-CITRIC ACID 500-334 MG/5ML PO SOLN: 30.0000 mL | ORAL | Status: DC | PRN

## 2022-05-06 MED ORDER — SODIUM CHLORIDE 0.9 % IV SOLN
2.0000 g | Freq: Four times a day (QID) | INTRAVENOUS | Status: DC
  Administered 2022-05-06 – 2022-05-07 (×2): 2 g via INTRAVENOUS
  Filled 2022-05-06 (×2): qty 2000

## 2022-05-06 MED ORDER — OXYCODONE-ACETAMINOPHEN 5-325 MG PO TABS: 2.0000 | ORAL_TABLET | ORAL | Status: DC | PRN

## 2022-05-06 MED ORDER — FENTANYL CITRATE (PF) 100 MCG/2ML IJ SOLN
100.0000 ug | INTRAMUSCULAR | Status: DC | PRN
  Administered 2022-05-06: 100 ug via INTRAVENOUS
  Filled 2022-05-06: qty 2

## 2022-05-06 MED ORDER — PHENYLEPHRINE 80 MCG/ML (10ML) SYRINGE FOR IV PUSH (FOR BLOOD PRESSURE SUPPORT): 80.0000 ug | PREFILLED_SYRINGE | INTRAVENOUS | Status: DC | PRN

## 2022-05-06 MED ORDER — OXYTOCIN BOLUS FROM INFUSION
333.0000 mL | Freq: Once | INTRAVENOUS | Status: AC
  Administered 2022-05-07: 333 mL via INTRAVENOUS

## 2022-05-06 NOTE — MAU Note (Signed)
.  Loretta Bird is a 27 y.o. at [redacted]w[redacted]d here in MAU reporting: SROM leaking fluid with discharge around 2300 last night. Pt states she felt like she peed with pressure and the fluid continued. Pt report constant vaginal cramping and pressure. Pt denies taken pain medications. Pt reports DFM since 0000 tonight. Pt denies VB, PIH s/s, and complications in the pregnancy.  Last intercourse 2 days ago GBS neg SVE 2.5cm Vertex   Onset of complaint: 2300 Pain score: 5/10 Vitals:   05/06/22 0103  BP: 119/85  Pulse: (!) 107  Resp: 18  SpO2: 98%     FHT:150 Lab orders placed from triage:

## 2022-05-06 NOTE — MAU Note (Signed)
Chart opened in error

## 2022-05-06 NOTE — Progress Notes (Signed)
Patient ID: Loretta Bird, female   DOB: Aug 18, 1995, 27 y.o.   MRN: [id number] Loretta Bird is a 27 y.o. G3P0020 at [redacted]w[redacted]d admitted for early labor  Subjective: comfortable with epidural and declines exam right now- reports it makes the catheter bother her. Feeling some mild pressure w/ contractions, but nothing bad.   Objective: BP 131/75   Pulse 96   Temp 98.6 F (37 C) (Oral)   Resp 17   Ht 5' 3"$  (1.6 m)   Wt 90.4 kg   LMP 07/26/2021 (Exact Date)   SpO2 99%   BMI 35.32 kg/m  Total I/O In: -  Out: 1150 [Urine:1150]  FHR baseline 135 bpm, Variability: moderate, Accelerations:present, Decelerations:  Present  earlies Toco: q 2-4 mins   SVE:   Dilation:  (patient refused cervical check) Effacement (%): 90 Station: Plus 1 Exam by:: lee  Pitocin @ 8 mu/min  Labs: Lab Results  Component Value Date   WBC 14.1 (H) 05/06/2022   HGB 8.6 (L) 05/06/2022   HCT 27.4 (L) 05/06/2022   MCV 77.0 (L) 05/06/2022   PLT 321 05/06/2022    Assessment / Plan: Augmentation of labor, AROM'd 1000, on pit @ 8, declines SVE at this time- ok w/ checking at 2200  Labor: active Fetal Wellbeing:  Category I Pain Control:  epidural Pre-eclampsia: N/A I/D:  GBS neg Anticipated MOD: NSVB  KRoma SchanzCNM, WHNP-BC 05/06/2022, 9:06 PM

## 2022-05-06 NOTE — Anesthesia Procedure Notes (Signed)
Epidural Patient location during procedure: OB Start time: 05/06/2022 11:15 AM End time: 05/06/2022 11:22 AM  Staffing Anesthesiologist: Santa Lighter, MD Performed: anesthesiologist   Preanesthetic Checklist Completed: patient identified, IV checked, risks and benefits discussed, monitors and equipment checked, pre-op evaluation and timeout performed  Epidural Patient position: sitting Prep: DuraPrep Patient monitoring: blood pressure and continuous pulse ox Approach: midline Location: L3-L4 Injection technique: LOR air  Needle:  Needle type: Tuohy  Needle gauge: 17 G Needle length: 9 cm Needle insertion depth: 6 cm Catheter size: 19 Gauge Catheter at skin depth: 11 cm Test dose: negative and Other (1% Lidocaine)  Additional Notes Patient identified.  Risk benefits discussed including failed block, incomplete pain control, headache, nerve damage, paralysis, blood pressure changes, nausea, vomiting, reactions to medication both toxic or allergic, and postpartum back pain.  Patient expressed understanding and wished to proceed.  All questions were answered.  Sterile technique used throughout procedure and epidural site dressed with sterile barrier dressing. No paresthesia or other complications noted. The patient did not experience any signs of intravascular injection such as tinnitus or metallic taste in mouth nor signs of intrathecal spread such as rapid motor block. Please see nursing notes for vital signs. Reason for block:procedure for pain

## 2022-05-06 NOTE — Progress Notes (Addendum)
Loretta Bird is a 27 y.o. G3P0020 at 6w4dby LMP admitted for early labor.   Subjective: Feeling some contractions. Complaining of discomfort with foley.   Objective: BP (!) 142/85   Pulse (!) 101   Temp 98.3 F (36.8 C) (Oral)   Resp 18   Ht 5' 3"$  (1.6 m)   Wt 90.4 kg   LMP 07/26/2021 (Exact Date)   SpO2 99%   BMI 35.32 kg/m  No intake/output data recorded. No intake/output data recorded.  FHT:  FHR: 130 bpm, variability: moderate,  accelerations:  Present,  decelerations:  Absent UC:   irregular, every 2-5 minutes SVE:   Dilation: 6  Effacement (%): 90 Station: -1 Exam by: Bird Tailor,cnm  Labs: Lab Results  Component Value Date   WBC 14.1 (H) 05/06/2022   HGB 8.6 (L) 05/06/2022   HCT 27.4 (L) 05/06/2022   MCV 77.0 (L) 05/06/2022   PLT 321 05/06/2022    Assessment / Plan:26 y.o. GBC:[id number]  Labor:  Active, progressing on Pitocin    Preeclampsia:   N/A Fetal Wellbeing:  Category I Pain Control:  Epidural  I/D:  n/a Anticipated MOD:  NSVD  AlStephens NovemberStudent-PA 05/06/2022, 2:10 PM  Midwife attestation I agree with the documentation in the student's note.   MeJulianne HandlerCNM 6:39 PM

## 2022-05-06 NOTE — Anesthesia Preprocedure Evaluation (Signed)
Anesthesia Evaluation  Patient identified by MRN, date of birth, ID band Patient awake    Reviewed: Allergy & Precautions, NPO status , Patient's Chart, lab work & pertinent test results  Airway Mallampati: III  TM Distance: >3 FB Neck ROM: Full    Dental  (+) Teeth Intact, Dental Advisory Given   Pulmonary neg pulmonary ROS   Pulmonary exam normal breath sounds clear to auscultation       Cardiovascular negative cardio ROS Normal cardiovascular exam Rhythm:Regular Rate:Normal     Neuro/Psych negative neurological ROS     GI/Hepatic negative GI ROS, Neg liver ROS,,,  Endo/Other  Obesity   Renal/GU negative Renal ROS     Musculoskeletal negative musculoskeletal ROS (+)    Abdominal   Peds  Hematology  (+) Blood dyscrasia, anemia Plt 321k    Anesthesia Other Findings Day of surgery medications reviewed with the patient.  Reproductive/Obstetrics (+) Pregnancy                             Anesthesia Physical Anesthesia Plan  ASA: 2  Anesthesia Plan: Epidural   Post-op Pain Management:    Induction:   PONV Risk Score and Plan: 2 and Treatment may vary due to age or medical condition  Airway Management Planned: Natural Airway  Additional Equipment:   Intra-op Plan:   Post-operative Plan:   Informed Consent: I have reviewed the patients History and Physical, chart, labs and discussed the procedure including the risks, benefits and alternatives for the proposed anesthesia with the patient or authorized representative who has indicated his/her understanding and acceptance.     Dental advisory given  Plan Discussed with:   Anesthesia Plan Comments: (Patient identified. Risks/Benefits/Options discussed with patient including but not limited to bleeding, infection, nerve damage, paralysis, failed block, incomplete pain control, headache, blood pressure changes, nausea, vomiting,  reactions to medication both or allergic, itching and postpartum back pain. Confirmed with bedside nurse the patient's most recent platelet count. Confirmed with patient that they are not currently taking any anticoagulation, have any bleeding history or any family history of bleeding disorders. Patient expressed understanding and wished to proceed. All questions were answered. )       Anesthesia Quick Evaluation

## 2022-05-06 NOTE — Progress Notes (Signed)
Labor Progress Note Adrienn Shiffman is a 27 y.o. G3P0020 at 69w4dpresented for early labor/postdates  S:  Feeling some ctx. Denies need for pain meds.   O:  BP 110/65   Pulse 89   Temp 98.3 F (36.8 C) (Oral)   Resp 18   Ht 5' 3"$  (1.6 m)   Wt 90.4 kg   LMP 07/26/2021 (Exact Date)   SpO2 98%   BMI 35.32 kg/m  EFM: baseline 135 bpm/ mod variability/ + accels/ no decels  Toco/IUPC: 1-4 SVE: Dilation: 4 Effacement (%): 70 Station: -2 Presentation: Vertex Exam by:: Vernadine Coombs,cnm Pitocin: 10 mu/min  A/P: 27y.o. G3P0020 435w4d1. Labor: latent, making progress 2. FWB: Cat I 3. Pain: analgesia/anesthesia/NO prn   Consented for AROM. Intolerant of exam, unclear if AROM successful. Continue Pitocin. Anticipate SVD.  MeJulianne HandlerCNM 10:12 AM

## 2022-05-06 NOTE — Progress Notes (Signed)
Patient ID: Loretta Bird, female   DOB: [birth year], 27 y.o.   MRN: [id number] Loretta Bird is a 27 y.o. G3P0020 at 41w4dadmitted for active labor  Subjective: no complaints and comfortable with epidural  Objective: BP 126/79   Pulse (!) 110   Temp 99.2 F (37.3 C) (Oral)   Resp 18   Ht 5' 3"$  (1.6 m)   Wt 90.4 kg   LMP 07/26/2021 (Exact Date)   SpO2 99%   BMI 35.32 kg/m  Total I/O In: -  Out: 1150 [Urine:1150]  FHR baseline 145 bpm, Variability: moderate, Accelerations:present, Decelerations:  Absent Toco: q 2-4 mins   SVE:   Dilation: 7 Effacement (%): 80 Station: -1 Exam by:: KKnute NeuCNM  Pitocin @ 8 mu/min  Labs: Lab Results  Component Value Date   WBC 14.1 (H) 05/06/2022   HGB 8.6 (L) 05/06/2022   HCT 27.4 (L) 05/06/2022   MCV 77.0 (L) 05/06/2022   PLT 321 05/06/2022    Assessment / Plan: Augmentation of labor, AROM 1000, pit @ 8 will continue to increase. If no change at next exam, will place IUPC  Labor: active Fetal Wellbeing:  Category I Pain Control:  epidural Pre-eclampsia: N/A I/D:  GBS neg Anticipated MOD: NSVB  KRoma SchanzCNM, WHNP-BC 05/06/2022, 10:13 PM

## 2022-05-06 NOTE — H&P (Signed)
OBSTETRIC ADMISSION HISTORY AND PHYSICAL  Loretta Bird is a 27 y.o. female G3P0020 with IUP at 41w4dby LMP presenting for SOL w/ DFM. She reports +FMs, No LOF, no VB, no blurry vision, headaches or peripheral edema, and RUQ pain.  She plans on breasat feeding. She is undecided on birth control/  [facility]   Dating: By LMP --->  Estimated Date of Delivery: 05/02/22  Sono:    @[redacted]w[redacted]d$ , CWD, normal anatomy, cephalic presentation,  posterior placenta, 727g, 54% EFW   Prenatal History/Complications: none  Past Medical History: Past Medical History:  Diagnosis Date   Anemia    Medical history non-contributory     Past Surgical History: Past Surgical History:  Procedure Laterality Date   DILATION AND EVACUATION N/A 03/26/2018   Procedure: DILATATION AND EVACUATION;  Surgeon: EHarlin Heys MD;  Location: [facility] ORS;  Service: Gynecology;  Laterality: N/A;    Obstetrical History: OB History     Gravida  3   Para      Term      Preterm      AB  2   Living         SAB  1   IAB      Ectopic      Multiple      Live Births              Social History Social History   Socioeconomic History   Marital status: Single    Spouse name: Not on file   Number of children: Not on file   Years of education: Not on file   Highest education level: Not on file  Occupational History   Not on file  Tobacco Use   Smoking status: Never   Smokeless tobacco: Never   Tobacco comments:    started about 2 years ago per pt  Vaping Use   Vaping Use: Never used  Substance and Sexual Activity   Alcohol use: Not Currently    Comment: occassionally   Drug use: Not Currently    Types: Marijuana    Comment: daily   Sexual activity: Yes    Partners: Male    Birth control/protection: None  Other Topics Concern   Not on file  Social History Narrative   Not on file   Social Determinants of Health   Financial Resource Strain: Not on file   Food Insecurity: Not on file  Transportation Needs: Not on file  Physical Activity: Not on file  Stress: Not on file  Social Connections: Not on file    Family History: Family History  Problem Relation Age of Onset   Asthma Neg Hx    Cancer Neg Hx    Diabetes Neg Hx    Heart disease Neg Hx    Hypertension Neg Hx    Stroke Neg Hx     Allergies: No Known Allergies  Medications Prior to Admission  Medication Sig Dispense Refill Last Dose   ferrous sulfate 325 (65 FE) MG EC tablet Take 1 tablet (325 mg total) by mouth every other day. 90 tablet 2 05/05/2022 at 1000   Prenatal Vit-Fe Fumarate-FA (PRENATAL MULTIVITAMIN) TABS tablet Take 1 tablet by mouth daily at 12 noon.   Past Week     Review of Systems   All systems reviewed and negative except as stated in HPI  Blood pressure 119/85, pulse (!) 107, resp. rate 18, height 5' 3"$  (1.6 m), weight 90.4 kg,  last menstrual period 07/26/2021, SpO2 98 %. General appearance: alert, cooperative, and appears stated age Lungs: Normal work of breathing  Heart: regular rate and rhythm Abdomen: soft, non-tender; bowel sounds normal  Extremities: Homans sign is negative, no sign of DVT Presentation: cephalic Fetal monitoringBaseline: 155 bpm, Variability: Fair (1-6 bpm), Accelerations: Reactive, and Decelerations: Absent Uterine activityFrequency: Every 5 minutes Dilation:  (Pt declined)   Prenatal labs: ABO, Rh: O/Positive/-- (07/18 1409) Antibody: Negative (07/18 1409) Rubella: <0.90 (07/18 1409) RPR: Non Reactive (11/22 0941)  HBsAg: surface antigen and antibody non reactive (06/22 0000)  HIV: Non Reactive (11/22 0941)  GBS: Negative/-- (01/17 1600)  1 hr Glucola Normal Genetic screening  LR Anatomy US Low lying placenta(resolved)   Prenatal Transfer Tool  Maternal Diabetes: No Genetic Screening: Normal Maternal Ultrasounds/Referrals: Normal Fetal Ultrasounds or other Referrals:  None Maternal Substance Abuse:   No Significant Maternal Medications:  None Significant Maternal Lab Results:  Group B Strep negative Number of Prenatal Visits:greater than 3 verified prenatal visits Other Comments:  None  Results for orders placed or performed during the hospital encounter of 05/06/22 (from the past 24 hour(s))  Fern Test   Collection Time: 05/06/22  1:23 AM  Result Value Ref Range   POCT Fern Test Negative = intact amniotic membranes     Patient Active Problem List   Diagnosis Date Noted   Anemia during pregnancy in third trimester 03/12/2022   Alpha thalassemia silent carrier 10/18/2021   Rubella non-immune status, antepartum 10/11/2021   Supervision of other normal pregnancy, antepartum 10/08/2021    Assessment/Plan:  Loretta Bird is a 27 y.o. G3P0020 at 50w4dhere for SOL. Also noted DFM and had prolonged deceleration in MAU which resolved spontaneously.    #Labor:Will augment with pitocin and continue to monitor  #Pain: Per patient request #FWB: Cat 1  #ID:  GBS neg #MOF: Breast #MOC:undecided considering OP IUD #Circ:  Yes   JConcepcion Living MD  05/06/2022, 1:58 AM

## 2022-05-06 NOTE — Progress Notes (Signed)
Pt informed that the ultrasound is considered a limited OB ultrasound and is not intended to be a complete ultrasound exam.  Patient also informed that the ultrasound is not being completed with the intent of assessing for fetal or placental anomalies or any pelvic abnormalities.  Explained that the purpose of today's ultrasound is to assess for presentation.  Patient acknowledges the purpose of the exam and the limitations of the study.    Vertex presentation confirmed 

## 2022-05-07 ENCOUNTER — Encounter (HOSPITAL_COMMUNITY): Payer: Self-pay | Admitting: Family Medicine

## 2022-05-07 DIAGNOSIS — O480 Post-term pregnancy: Secondary | ICD-10-CM | POA: Diagnosis not present

## 2022-05-07 DIAGNOSIS — O411230 Chorioamnionitis, third trimester, not applicable or unspecified: Secondary | ICD-10-CM | POA: Diagnosis not present

## 2022-05-07 DIAGNOSIS — Z3A40 40 weeks gestation of pregnancy: Secondary | ICD-10-CM | POA: Diagnosis not present

## 2022-05-07 DIAGNOSIS — O368130 Decreased fetal movements, third trimester, not applicable or unspecified: Secondary | ICD-10-CM | POA: Diagnosis not present

## 2022-05-07 LAB — PREPARE RBC (CROSSMATCH)

## 2022-05-07 MED ORDER — IBUPROFEN 600 MG PO TABS
600.0000 mg | ORAL_TABLET | Freq: Four times a day (QID) | ORAL | Status: DC
  Administered 2022-05-07 – 2022-05-09 (×8): 600 mg via ORAL
  Filled 2022-05-07 (×8): qty 1

## 2022-05-07 MED ORDER — DIPHENHYDRAMINE HCL 50 MG/ML IJ SOLN
25.0000 mg | Freq: Once | INTRAMUSCULAR | Status: AC
  Administered 2022-05-07: 25 mg via INTRAVENOUS
  Filled 2022-05-07: qty 1

## 2022-05-07 MED ORDER — ACETAMINOPHEN 325 MG PO TABS
650.0000 mg | ORAL_TABLET | ORAL | Status: DC | PRN
  Administered 2022-05-07 – 2022-05-08 (×4): 650 mg via ORAL
  Filled 2022-05-07 (×4): qty 2

## 2022-05-07 MED ORDER — DIPHENHYDRAMINE HCL 25 MG PO CAPS: 25.0000 mg | ORAL_CAPSULE | Freq: Four times a day (QID) | ORAL | Status: DC | PRN

## 2022-05-07 MED ORDER — BENZOCAINE-MENTHOL 20-0.5 % EX AERO
1.0000 | INHALATION_SPRAY | CUTANEOUS | Status: DC | PRN
  Administered 2022-05-07 – 2022-05-09 (×2): 1 via TOPICAL
  Filled 2022-05-07 (×2): qty 56

## 2022-05-07 MED ORDER — ONDANSETRON HCL 4 MG PO TABS: 4.0000 mg | ORAL_TABLET | ORAL | Status: DC | PRN

## 2022-05-07 MED ORDER — MEASLES, MUMPS & RUBELLA VAC IJ SOLR: 0.5000 mL | Freq: Once | INTRAMUSCULAR | Status: DC

## 2022-05-07 MED ORDER — ONDANSETRON HCL 4 MG/2ML IJ SOLN: 4.0000 mg | INTRAMUSCULAR | Status: DC | PRN

## 2022-05-07 MED ORDER — IRON SUCROSE 500 MG IVPB - SIMPLE MED
500.0000 mg | Freq: Once | INTRAVENOUS | Status: AC
  Administered 2022-05-07: 500 mg via INTRAVENOUS
  Filled 2022-05-07: qty 275

## 2022-05-07 MED ORDER — ZOLPIDEM TARTRATE 5 MG PO TABS: 5.0000 mg | ORAL_TABLET | Freq: Every evening | ORAL | Status: DC | PRN

## 2022-05-07 MED ORDER — DIBUCAINE (PERIANAL) 1 % EX OINT: 1.0000 | TOPICAL_OINTMENT | CUTANEOUS | Status: DC | PRN

## 2022-05-07 MED ORDER — FERROUS SULFATE 325 (65 FE) MG PO TABS
325.0000 mg | ORAL_TABLET | ORAL | Status: DC
  Administered 2022-05-07 – 2022-05-09 (×2): 325 mg via ORAL
  Filled 2022-05-07 (×2): qty 1

## 2022-05-07 MED ORDER — PRENATAL MULTIVITAMIN CH
1.0000 | ORAL_TABLET | Freq: Every day | ORAL | Status: DC
  Administered 2022-05-08 – 2022-05-09 (×2): 1 via ORAL
  Filled 2022-05-07 (×3): qty 1

## 2022-05-07 MED ORDER — SENNOSIDES-DOCUSATE SODIUM 8.6-50 MG PO TABS
2.0000 | ORAL_TABLET | ORAL | Status: DC
  Administered 2022-05-07 – 2022-05-08 (×2): 2 via ORAL
  Filled 2022-05-07 (×2): qty 2

## 2022-05-07 MED ORDER — SIMETHICONE 80 MG PO CHEW: 80.0000 mg | CHEWABLE_TABLET | ORAL | Status: DC | PRN

## 2022-05-07 MED ORDER — WITCH HAZEL-GLYCERIN EX PADS: 1.0000 | MEDICATED_PAD | CUTANEOUS | Status: DC | PRN

## 2022-05-07 MED ORDER — COCONUT OIL OIL: 1.0000 | TOPICAL_OIL | Status: DC | PRN

## 2022-05-07 MED ORDER — TETANUS-DIPHTH-ACELL PERTUSSIS 5-2.5-18.5 LF-MCG/0.5 IM SUSY: 0.5000 mL | PREFILLED_SYRINGE | Freq: Once | INTRAMUSCULAR | Status: DC

## 2022-05-07 NOTE — Anesthesia Postprocedure Evaluation (Signed)
Anesthesia Post Note  Patient: Loretta Bird  Procedure(s) Performed: AN AD [place]     Patient location during evaluation: Mother Baby Anesthesia Type: Epidural Level of consciousness: awake and alert Pain management: pain level controlled Vital Signs Assessment: post-procedure vital signs reviewed and stable Respiratory status: spontaneous breathing, nonlabored ventilation and respiratory function stable Cardiovascular status: stable Postop Assessment: no headache, no backache and epidural receding Anesthetic complications: no  No notable events documented.  Last Vitals:  Vitals:   05/07/22 1219 05/07/22 1323  BP: 120/77 107/74  Pulse: 95 88  Resp: 18 18  Temp: 37.3 C 37.4 C  SpO2: 98% 97%    Last Pain:  Vitals:   05/07/22 1556  TempSrc:   PainSc: 0-No pain   Pain Goal: Patients Stated Pain Goal: 0 (05/07/22 0506)                 Amberli Ruegg

## 2022-05-07 NOTE — Progress Notes (Signed)
Patient ID: Loretta Bird, female   DOB: 29-Oct-1995, 27 y.o.   MRN: [id number]  Loretta Bird MRN: [id number]  Subjective: -Care assumed of 27 y.o. G3P0020 at 43w5dwho presents for SOL w/ DFM. In room to meet acquaintance of patient and family.  Patient reports rectal pressure and requests removal of IUPC d/t discomfort.   Objective: BP 115/67   Pulse 99   Temp 99.8 F (37.7 C) (Axillary)   Resp 18   Ht 5' 3"$  (1.6 m)   Wt 90.4 kg   LMP 07/26/2021 (Exact Date)   SpO2 96%   BMI 35.32 kg/m  I/O last 3 completed shifts: In: -  Out: 1600 [Urine:1600] No intake/output data recorded.  Fetal Monitoring: FHT: 120 bpm, Mod Var, -Decels, +Accels UC: Q1-481m, palpates moderate    Physical Exam: General appearance: alert, well appearing, and in no distress. Chest: normal rate and regular rhythm.  not examined. Abdominal exam: Soft, NT, Gravid. Extremities: Non pitting edema Skin exam: Warm Dry  Vaginal Exam: SVE:   Dilation: 10 Effacement (%): 100 Station: Plus 2, Plus 3 Exam by:: J.Milinda CaveCNM Membranes:AROM x 23hrs Internal Monitors: IUPC removed.   Augmentation/Induction: Pitocin:2271mmin Cytotec: None  Assessment:  IUP at 40.5 weeks Cat I FT  2nd Stage  Plan: -IUPC removed without issues per patient requests. -Patient requests more time before pushing. -Will allow to rest and focus on breathing techniques then return.  -Continue other mgmt as ordered  JesRiley ChurchesNM 05/07/2022, 9:04 AM

## 2022-05-07 NOTE — Progress Notes (Signed)
Patient ID: Loretta Bird, female   DOB: [birth year], 27 y.o.   MRN: [id number] Loretta Bird is a 27 y.o. G3P0020 at 56w5dadmitted for  early labor/DFM/decel in MAU  Subjective: uncomfortable w/ contractions and feeling urge to push  Objective: BP 112/67   Pulse 91   Temp 99.8 F (37.7 C) (Axillary)   Resp 16   Ht 5' 3"$  (1.6 m)   Wt 90.4 kg   LMP 07/26/2021 (Exact Date)   SpO2 98%   BMI 35.32 kg/m  Total I/O In: -  Out: 1150 [Urine:1150]  FHR baseline 115 bpm, Variability: moderate, Accelerations:present, Decelerations:  Present  early; prolonged right after exam Toco: q 2-5 mins, MVUs inadequate  SVE:   Dilation: 8 Effacement (%): 80, 90 Station: Plus 2 Exam by:: KKnute Neu CNM Big fat anterior lip, maybe a little less tight s/p IV benadryl 240m but still as big  Pitocin @ 22 mu/min  Labs: Lab Results  Component Value Date   WBC 14.1 (H) 05/06/2022   HGB 8.6 (L) 05/06/2022   HCT 27.4 (L) 05/06/2022   MCV 77.0 (L) 05/06/2022   PLT 321 05/06/2022    Assessment / Plan: Admitted for early labor/DFM/decel in MAU, pit started 2/13 @ 0300, AROM 2/13 @ 1000, pit @ 22, MVUs have never been adequate, cx still 8 w/ fat anterior lip that is right at introitus s/p IV benadryl 2544m1, but vtx continuing to descend. Pt strongly desires vaginal delivery and wishes to continue trying. Discussed w/ Dr. AnyHarolyn Rutherfordill try another dose of IV benadryl 34m95md ice pack to anterior lip, as well as continued repositioning.   Labor: active Fetal Wellbeing:  Category II Pain Control:  epidural Pre-eclampsia: N/A I/D:  Amp & Gent for Triple I Anticipated MOD: hopeful for vaginal birth  KimbBrayNPMemorial Hospital And Health Care Center4/2024, 6:10 AM

## 2022-05-07 NOTE — Progress Notes (Signed)
Patient ID: Loretta Bird, female   DOB: [birth year], 27 y.o.   MRN: [id number] Shakelia Ludy is a 27 y.o. G3P0020 at 58w5dadmitted for  early labor/DFM/decel in MAU  Subjective: Feeling more pressure, requests exam  Objective: BP 125/69   Pulse 99   Temp 99.8 F (37.7 C) (Axillary)   Resp 18   Ht 5' 3"$  (1.6 m)   Wt 90.4 kg   LMP 07/26/2021 (Exact Date)   SpO2 97%   BMI 35.32 kg/m  No intake/output data recorded.  FHR baseline 115 bpm, Variability: moderate, Accelerations:present, Decelerations:  Present  earlies Toco: q 2-5 mins  MVUs inadequate  SVE:   Dilation: Lip/rim Effacement (%): 100 Station: Plus 2, Plus 3 Exam by:: KKnute NeuCNM  Pitocin @ 22 mu/min  Labs: Lab Results  Component Value Date   WBC 14.1 (H) 05/06/2022   HGB 8.6 (L) 05/06/2022   HCT 27.4 (L) 05/06/2022   MCV 77.0 (L) 05/06/2022   PLT 321 05/06/2022    Assessment / Plan: Admitted for early labor/DFM/decel in MAU, AROM 2/13 @ 1000, pit @ 22, feeling more pressure, requested exam- now only has ant lip- has definitely reduced in size, vtx +2 to +3, does not want to reduce lip/push right now  Labor: active Fetal Wellbeing:  Category I Pain Control:  epidural Pre-eclampsia: N/A I/D:  Amp & Gent for Triple I Anticipated MOD: NSVB  KRoma SchanzCNM, WHNP-BC 05/07/2022, 7:13 AM

## 2022-05-07 NOTE — Progress Notes (Signed)
Patient ID: Loretta Bird, female   DOB: [birth year], 27 y.o.   MRN: [id number] Loretta Bird is a 27 y.o. G3P0020 at 73w5dadmitted for early labor, DFM and decel in MAU  Called by RN, has fever, refusing increase in pit  Subjective: Comfortable w/ epidural, declines increase in pitocin as feels it made her n/v when increased last time  Objective: BP 108/77   Pulse (!) 107   Temp (!) 100.8 F (38.2 C) (Axillary)   Resp 17   Ht 5' 3"$  (1.6 m)   Wt 90.4 kg   LMP 07/26/2021 (Exact Date)   SpO2 99%   BMI 35.32 kg/m  Total I/O In: -  Out: 1150 [Urine:1150]  FHR baseline 155 bpm, Variability: moderate, Accelerations:present, Decelerations:  Present  earlies Toco: q 2-5 mins   SVE:   Dilation: 7.5 Effacement (%): 80 Station: -1 Exam by:: KKnute NeuCNM  Pitocin @ 10 mu/min  IUPC placed w/o difficulty  Labs: Lab Results  Component Value Date   WBC 14.1 (H) 05/06/2022   HGB 8.6 (L) 05/06/2022   HCT 27.4 (L) 05/06/2022   MCV 77.0 (L) 05/06/2022   PLT 321 05/06/2022    Assessment / Plan: Admitted in early labor (2.5/70/-2) w/ decreased fetal movement and decel in MAU; augmented w/ pit (started 2/13 @ 0300) & AROM (2/13 @ 1000). 6cm @ 1400, then 9 @ 1813 by RN, then 7/80/-1 @ 2209 by my exam. Now w/ Triple I- starting Amp & Gent and giving 1g apap po. Discussed increase in pitocin is needed to help try to achieve vaginal delivery. Pt hesitant as feels last increase made her n/v . Discussed placing IUPC to assess adequacy of contractions, then if not adequate would definitely recommend increasing pitocin, and can have antiemetics if necessary. Pt agrees w/ this plan, as she would like to avoid a c/s.   Labor: active Fetal Wellbeing:  Category I Pain Control:  epidural Pre-eclampsia: N/A I/D:  Amp & Gent for Triple I Anticipated MOD: hopeful for vaginal birth  KKem[facility] WGrand Rapids Surgical Suites PLLC2/14/2024, 12:09 AM

## 2022-05-07 NOTE — Progress Notes (Signed)
Patient ID: Loretta Bird, female   DOB: [birth year], 27 y.o.   MRN: [id number] Loretta Bird is a 27 y.o. G3P0020 at 58w5dadmitted for  early labor, DFM, decel in MAU  Subjective: uncomfortable w/ contractions and feeling pressure  Objective: BP 129/78   Pulse (!) 102   Temp 98.2 F (36.8 C) (Axillary)   Resp 17   Ht 5' 3"$  (1.6 m)   Wt 90.4 kg   LMP 07/26/2021 (Exact Date)   SpO2 97%   BMI 35.32 kg/m  Total I/O In: -  Out: 1150 [Urine:1150]  FHR baseline 125 bpm, Variability: moderate, Accelerations:present, Decelerations:  Present  earlies, occ mild variables Toco: q 2-5 mins, MVUs 140  SVE:   Dilation: 8 Effacement (%): 80, 90 Station: Plus 1 Exam by:: Loretta Bird, CNM Big fat anterior lip  Pitocin @ 155mmin  Labs: Lab Results  Component Value Date   WBC 14.1 (H) 05/06/2022   HGB 8.6 (L) 05/06/2022   HCT 27.4 (L) 05/06/2022   MCV 77.0 (L) 05/06/2022   PLT 321 05/06/2022    Assessment / Plan: Admitted in early labor/DFM/decel in MAU, AROM 2/13 @ 1000, pit @ 18, IUPC w/ inadequate MVUs but pt is making change and feeling pressure. Has fat ant lip, will try IV benadryl & position changes  Labor: active Fetal Wellbeing:  Category II Pain Control:  epidural Pre-eclampsia: N/A I/D:  Amp & Gent for Triple I Anticipated MOD: hopeful for vaginal birth  KiDiamondWHProvidence [facility]/14/2024, 3:56 AM

## 2022-05-07 NOTE — Discharge Summary (Signed)
Postpartum Discharge Summary    Patient Name: Loretta Bird DOB: 04-05-1995 MRN: [id number]  Date of admission: 05/06/2022 Delivery date:05/07/2022  Delivering provider: Gavin Pound  Date of discharge: 05/09/2022  Admitting diagnosis: Post term pregnancy over 40 weeks [O48.0] Intrauterine pregnancy: [redacted]w[redacted]d    Secondary diagnosis:  Principal Problem:   Vaginal delivery Active Problems:   Supervision of other normal pregnancy, antepartum   Rubella non-immune status, antepartum   Alpha thalassemia silent carrier   Anemia during pregnancy in third trimester   Post term pregnancy over 40 weeks   Obstetric labial laceration, delivered, current hospitalization  Additional problems: n/a    Discharge diagnosis: Term Pregnancy Delivered and Anemia                                              Post partum procedures: Venofer Augmentation: AROM and Pitocin Complications: Intrauterine Inflammation or infection (Chorioamniotis)  Hospital course: Onset of Labor With Vaginal Delivery      27y.o. yo G3P0020 at [id number] admitted in Latent Labor on 05/06/2022. Labor course was complicated byTriple I which was treated with Gent/Amp per protocol.  Patient required pitocin for augmentation and progressed to complete. She delivered without issues.   Membrane Rupture Time/Date: 10:00 AM ,05/06/2022   Delivery Method:Vaginal, Spontaneous  Episiotomy: None  Lacerations:  Labial  Patient had a postpartum course complicated by anemia, requiring IV Iron.  She is ambulating, tolerating a regular diet, passing flatus, and urinating well. Patient is discharged home in stable condition on 05/09/22.  Newborn Data: Birth date:05/07/2022  Birth time:9:50 AM  Gender:Female  Living status:Living  Apgars:7 ,9  Weight:4280 g   Magnesium Sulfate received: No BMZ received: No Rhophylac:N/A MMR:No T-DaP: no Flu: No Transfusion:No  Physical exam  Vitals:   05/08/22 1241 05/08/22 1435 05/08/22 2137 05/09/22  0508  BP:  102/66 118/67 114/69  Pulse:  85 99 80  Resp:  18 18 18  $ Temp: 98 F (36.7 C) 98.2 F (36.8 C) 98.1 F (36.7 C) 98 F (36.7 C)  TempSrc: Oral Oral Oral Oral  SpO2:  98% 100% 100%  Weight:      Height:       General: alert, cooperative, and no distress Lochia: appropriate Uterine Fundus: firm Incision: N/A DVT Evaluation: Calf/Ankle edema is present Labs: Lab Results  Component Value Date   WBC 21.8 (H) 05/08/2022   HGB 7.4 (L) 05/08/2022   HCT 23.4 (L) 05/08/2022   MCV 76.5 (L) 05/08/2022   PLT 254 05/08/2022      Latest Ref Rng & Units 11/02/2021    6:53 AM  CMP  Glucose 70 - 99 mg/dL 84   BUN 6 - 20 mg/dL 6   Creatinine 0.44 - 1.00 mg/dL 0.45   Sodium 135 - 145 mmol/L 135   Potassium 3.5 - 5.1 mmol/L 3.6   Chloride 98 - 111 mmol/L 105   CO2 22 - 32 mmol/L 23   Calcium 8.9 - 10.3 mg/dL 9.0   Total Protein 6.5 - 8.1 g/dL 6.7   Total Bilirubin 0.3 - 1.2 mg/dL 0.4   Alkaline Phos 38 - 126 U/L 33   AST 15 - 41 U/L 18   ALT 0 - 44 U/L 10    Edinburgh Score:    05/07/2022    2:37 PM  Edinburgh Postnatal Depression Scale Screening Tool  I  have been able to laugh and see the funny side of things. 0  I have looked forward with enjoyment to things. 1  I have blamed myself unnecessarily when things went wrong. 0  I have been anxious or worried for no good reason. 2  I have felt scared or panicky for no good reason. 1  Things have been getting on top of me. 1  I have been so unhappy that I have had difficulty sleeping. 0  I have felt sad or miserable. 0  I have been so unhappy that I have been crying. 0  The thought of harming myself has occurred to me. 0  Edinburgh Postnatal Depression Scale Total 5     After visit meds:  Allergies as of 05/09/2022   No Known Allergies      Medication List     STOP taking these medications    ferrous sulfate 325 (65 FE) MG EC tablet Replaced by: ferrous sulfate 325 (65 FE) MG tablet   prenatal multivitamin  Tabs tablet       TAKE these medications    acetaminophen 325 MG tablet Commonly known as: Tylenol Take 2 tablets (650 mg total) by mouth every 4 (four) hours as needed (for pain scale < 4).   benzocaine-Menthol 20-0.5 % Aero Commonly known as: DERMOPLAST Apply 1 Application topically as needed for irritation (perineal discomfort).   ferrous sulfate 325 (65 FE) MG tablet Take 1 tablet (325 mg total) by mouth every other day. Replaces: ferrous sulfate 325 (65 FE) MG EC tablet   ibuprofen 600 MG tablet Commonly known as: ADVIL Take 1 tablet (600 mg total) by mouth every 6 (six) hours.   senna-docusate 8.6-50 MG tablet Commonly known as: Senokot-S Take 2 tablets by mouth daily.         Discharge home in stable condition Infant Feeding: Bottle and Breast Infant Disposition:home with mother Discharge instruction: per After Visit Summary and Postpartum booklet. Activity: Advance as tolerated. Pelvic rest for 6 weeks.  Diet: routine diet Future Appointments: Future Appointments  Date Time Provider [place]  06/17/2022  8:55 AM Anyanwu, Sallyanne Havers, MD [facility]   Follow up Visit: PPV message sent 05/07/2022 at 1057  Please schedule this patient for a In person postpartum visit in 4 weeks with the following provider: Any provider. Additional Postpartum F/U: None   Low risk pregnancy complicated by:  Anemia Delivery mode:  Vaginal, Spontaneous  Anticipated Birth Control:  IUD  05/09/2022 Shelda Pal, DO

## 2022-05-07 NOTE — Progress Notes (Signed)
Patient ID: Loretta Bird, female   DOB: Nov 20, 1995, 27 y.o.   MRN: [id number]    CIRCUMCISION CONSENT  Patient and SO expresses desire for infant circumcision.  Informed that [facility] can perform said procedure and circumcision procedure details discussed.    -It was emphasized that this is an elective procedure.   -Risks and benefits of procedure were reviewed including, but not limited to:  *Benefits include reduction in the rates of urinary tract infection (UTI), penile cancer, some sexually transmitted infections, penile inflammatory, and retractile disorders, as well as easier hygiene.   *Risks include bleeding, infection, injury of glans which may lead to need for additional surgery, penile deformity, or urinary tract issues, unsatisfactory cosmetic appearance and other potential complications related to the procedure.   -Informed that procedure will not be performed if provider deems inappropriate d/t penile size, noted deformity, or unsatisfactory pediatric evaluation. -Patient wants to proceed with circumcision. -Circumcision to be done pending pediatric evaluation of infant.  -Post circumcision care discussed. -L&D Team updated  Maryann Conners MSN, CNM Advanced Practice Provider, Center for [facility] 05/07/2022 11:05 AM

## 2022-05-08 LAB — CBC
HCT: 23.4 % — ABNORMAL LOW (ref 36.0–46.0)
Hemoglobin: 7.4 g/dL — ABNORMAL LOW (ref 12.0–15.0)
MCH: 24.2 pg — ABNORMAL LOW (ref 26.0–34.0)
MCHC: 31.6 g/dL (ref 30.0–36.0)
MCV: 76.5 fL — ABNORMAL LOW (ref 80.0–100.0)
Platelets: 254 10*3/uL (ref 150–400)
RBC: 3.06 MIL/uL — ABNORMAL LOW (ref 3.87–5.11)
RDW: 15.3 % (ref 11.5–15.5)
WBC: 21.8 10*3/uL — ABNORMAL HIGH (ref 4.0–10.5)
nRBC: 0.1 % (ref 0.0–0.2)

## 2022-05-08 MED ORDER — FERROUS FUMARATE 324 (106 FE) MG PO TABS: 1.0000 | ORAL_TABLET | ORAL | Status: DC

## 2022-05-08 NOTE — Progress Notes (Addendum)
Post Partum Day #1 Subjective: no complaints, up ad lib, and tolerating PO; bottlefeeding, but unhappy with baby's response to the formula; denies dizziness w ambulation (s/p Venofer); plan on an outpatient IUD for contraception  Objective: Blood pressure 100/65, pulse 91, temperature 98.5 F (36.9 C), temperature source Oral, resp. rate 18, height 5' 3"$  (1.6 m), weight 90.4 kg, last menstrual period 07/26/2021, SpO2 97 %, unknown if currently breastfeeding.  Physical Exam:  General: cooperative, fatigued, and no distress Lochia: appropriate Uterine Fundus: firm DVT Evaluation: No evidence of DVT seen on physical exam.  Recent Labs    05/06/22 0202 05/08/22 0510  HGB 8.6* 7.4*  HCT 27.4* 23.4*    Assessment/Plan: Plan for discharge tomorrow and Circumcision prior to discharge Continue qod Fe Recommended MMR vaccine   LOS: 2 days   Myrtis Ser, CNM 05/08/2022, 7:37 AM

## 2022-05-08 NOTE — Lactation Note (Signed)
This note was copied from a baby's chart. Lactation Consultation Note  Patient Name: Loretta Bird [id number] Date: 05/08/2022 Reason for consult: Initial assessment;Difficult latch;Primapara;1st time breastfeeding;Term Age:25 hours   LC in to visit with Loretta Bird of term baby delivered vaginally.  Bird has struggled with baby bottle feeding, volumes of 2-15 ml and some spitting.  Bird states she didn't try to breastfeed due to hearing from a nurse, that she shouldn't breastfeed because Bird had a fever.    Offered to assist with latching baby as baby was showing vigorous feeding cues.  Bird seemed hesitant.  Bird states she "did" want to breastfeed.  Bird is concerned as baby hasn't "liked" the formula.  Bird agreed to an assist with positioning and latching to the breast.  Bird's left nipple is edematous and difficult to compress the areola.  Right nipple is much more compressible.  Reviewed breast massage and hand expression.    Oral assessment-notched upper gum ridge and labial frenulum noted, suspected short lingual frenulum as tongue is u-shaped when crying.  Baby unable to attain a deep latch in football hold, rolled blanket under breast for support. LC initiated a 24 mm nipple shield, showing Bird how to properly place, nipple pulled into shield.  Baby able to latch deeper and sustain.  As baby continued, baby became rhythmic and deep jaw extensions noted.  Swallows identified.  Bird taught to use alternate breast compression to increase milk transfer.  Colostrum noted in shield when baby came off and burped.  Baby went right back on and was sucking more vigorously.  [place] set up a DEBP and instructed Bird to pump after breastfeeding.  Cleaning bins provided and instruction on process shared.  Plan- 1-Keep baby STS as much as possible 2- Offer the breast often with feeding cues, using nipple shield to help with latch 3-Pump both breasts 15 mins after breast feeding 4-ask for help prn, OP  lactation follow-up very important.   Maternal Data Has patient been taught Hand Expression?: Yes Does the patient have breastfeeding experience prior to this delivery?: No  Feeding Mother's Current Feeding Choice: Breast Milk and Formula  LATCH Score Latch: Repeated attempts needed to sustain latch, nipple held in mouth throughout feeding, stimulation needed to elicit sucking reflex.  Audible Swallowing: Spontaneous and intermittent  Type of Nipple: Everted at rest and after stimulation (with nipple shield 24 mm)  Comfort (Breast/Nipple): Soft / non-tender  Hold (Positioning): Assistance needed to correctly position infant at breast and maintain latch.  LATCH Score: 8   Lactation Tools Discussed/Used Tools: Nipple Shields;Pump;Flanges Nipple shield size: 24 Flange Size: 24 Breast pump type: Manual;Double-Electric Breast Pump Pump Education: Setup, frequency, and cleaning;Milk Storage Reason for Pumping: Support milk supply Pumping frequency: Encouraged pumping on initiation setting after breastfeeding  Interventions Interventions: Breast feeding basics reviewed;Assisted with latch;Skin to skin;Breast massage;Hand express;Pre-pump if needed;Breast compression;Adjust position;Support pillows;Position options;Hand pump;DEBP;Pace feeding;LC Services brochure  Discharge Pump: Manual (submitted STORK request) [place] Program: Yes  Consult Status Consult Status: Follow-up Date: 05/09/22 Follow-up type: In-patient    Loretta Bird 05/08/2022, 10:26 AM

## 2022-05-09 ENCOUNTER — Other Ambulatory Visit (HOSPITAL_COMMUNITY): Payer: Self-pay

## 2022-05-09 ENCOUNTER — Inpatient Hospital Stay (HOSPITAL_COMMUNITY): Admission: RE | Admit: 2022-05-09 | Payer: Medicaid Other | Source: Home / Self Care | Admitting: Family Medicine

## 2022-05-09 ENCOUNTER — Inpatient Hospital Stay (HOSPITAL_COMMUNITY): Payer: Medicaid Other

## 2022-05-09 MED ORDER — ACETAMINOPHEN 325 MG PO TABS: 650.0000 mg | ORAL_TABLET | ORAL | 0 refills | Status: DC | PRN

## 2022-05-09 MED ORDER — ACETAMINOPHEN 325 MG PO TABS
650.0000 mg | ORAL_TABLET | ORAL | 0 refills | Status: DC | PRN
  Filled 2022-05-09: qty 30, 3d supply, fill #0

## 2022-05-09 MED ORDER — BENZOCAINE-MENTHOL 20-0.5 % EX AERO: 1.0000 | INHALATION_SPRAY | CUTANEOUS | 0 refills | Status: DC | PRN

## 2022-05-09 MED ORDER — BENZOCAINE-MENTHOL 20-0.5 % EX AERO
1.0000 | INHALATION_SPRAY | CUTANEOUS | 0 refills | Status: DC | PRN
  Filled 2022-05-09: qty 78, fill #0

## 2022-05-09 MED ORDER — FERROUS SULFATE 325 (65 FE) MG PO TABS: 325.0000 mg | ORAL_TABLET | ORAL | 3 refills | Status: DC

## 2022-05-09 MED ORDER — IBUPROFEN 600 MG PO TABS: 600.0000 mg | ORAL_TABLET | Freq: Four times a day (QID) | ORAL | 0 refills | Status: DC

## 2022-05-09 MED ORDER — SENNOSIDES-DOCUSATE SODIUM 8.6-50 MG PO TABS: 2.0000 | ORAL_TABLET | ORAL | 0 refills | Status: DC

## 2022-05-09 MED ORDER — IBUPROFEN 600 MG PO TABS
600.0000 mg | ORAL_TABLET | Freq: Four times a day (QID) | ORAL | 0 refills | Status: DC
  Filled 2022-05-09: qty 30, 8d supply, fill #0

## 2022-05-09 MED ORDER — FERROUS SULFATE 325 (65 FE) MG PO TABS
325.0000 mg | ORAL_TABLET | ORAL | 3 refills | Status: DC
  Filled 2022-05-09: qty 30, 60d supply, fill #0

## 2022-05-10 LAB — TYPE AND SCREEN
ABO/RH(D): O POS
Antibody Screen: NEGATIVE
Unit division: 0
Unit division: 0

## 2022-05-10 LAB — BPAM RBC
Blood Product Expiration Date: 202403142359
Blood Product Expiration Date: 202403142359
Unit Type and Rh: 5100
Unit Type and Rh: 5100

## 2022-05-19 ENCOUNTER — Telehealth (HOSPITAL_COMMUNITY): Payer: Self-pay

## 2022-05-19 NOTE — Telephone Encounter (Signed)
Patient reports that she is doing fine and that her bleeding has slowed down. Patient declines questions/concerns about her health and healing.  Patient reports that baby is doing good. Baby sleeps in a bassinet. RN reviewed ABC's of safe sleep with patient. Patient declines any questions or concerns about baby.  EPDS score is 6.  [facility]   05/19/22,1802

## 2022-05-23 DIAGNOSIS — Z419 Encounter for procedure for purposes other than remedying health state, unspecified: Secondary | ICD-10-CM | POA: Diagnosis not present

## 2022-06-17 ENCOUNTER — Encounter: Payer: Self-pay | Admitting: Obstetrics & Gynecology

## 2022-06-17 ENCOUNTER — Ambulatory Visit (INDEPENDENT_AMBULATORY_CARE_PROVIDER_SITE_OTHER): Payer: Medicaid Other | Admitting: Obstetrics & Gynecology

## 2022-06-17 ENCOUNTER — Other Ambulatory Visit (HOSPITAL_COMMUNITY)
Admission: RE | Admit: 2022-06-17 | Discharge: 2022-06-17 | Disposition: A | Discharge status: Home / Self Care | Payer: Medicaid Other | Source: Ambulatory Visit | Attending: Obstetrics & Gynecology | Admitting: Obstetrics & Gynecology

## 2022-06-17 DIAGNOSIS — B9689 Other specified bacterial agents as the cause of diseases classified elsewhere: Secondary | ICD-10-CM | POA: Diagnosis not present

## 2022-06-17 DIAGNOSIS — N760 Acute vaginitis: Secondary | ICD-10-CM | POA: Diagnosis not present

## 2022-06-17 DIAGNOSIS — Z30430 Encounter for insertion of intrauterine contraceptive device: Secondary | ICD-10-CM | POA: Diagnosis not present

## 2022-06-17 DIAGNOSIS — N898 Other specified noninflammatory disorders of vagina: Secondary | ICD-10-CM | POA: Diagnosis not present

## 2022-06-17 LAB — POCT URINE PREGNANCY: Preg Test, Ur: NEGATIVE

## 2022-06-17 MED ORDER — LEVONORGESTREL 20 MCG/DAY IU IUD
1.0000 | INTRAUTERINE_SYSTEM | Freq: Once | INTRAUTERINE | Status: AC
  Administered 2022-06-17: 1 via INTRAUTERINE

## 2022-06-17 NOTE — Patient Instructions (Signed)

## 2022-06-17 NOTE — Progress Notes (Signed)
[place] Partum Visit Note  Loretta Bird is a 27 y.o. G68P1021 female who presents for a postpartum visit. She is 5 weeks postpartum following a normal spontaneous vaginal delivery.  I have fully reviewed the prenatal and intrapartum course. The delivery was at 40 gestational weeks.  Anesthesia: epidural. Postpartum course has been uncomplicated. Baby is doing well. Baby is feeding by breast. Bleeding no bleeding. Bowel function is normal. Bladder function is  notes feeling as if she has to push to empty . Patient is not sexually active. Desired contraception method is  Mirena IUD  . Postpartum depression screening: negative.  The pregnancy intention screening data noted above was reviewed. Potential methods of contraception were discussed. The patient elected to proceed with No data recorded.   Edinburgh Postnatal Depression Scale - 06/17/22 0921       Edinburgh Postnatal Depression Scale:  In the Past 7 Days   I have been able to laugh and see the funny side of things. 0    I have looked forward with enjoyment to things. 0    I have blamed myself unnecessarily when things went wrong. 0    I have been anxious or worried for no good reason. 0    I have felt scared or panicky for no good reason. 0    Things have been getting on top of me. 0    I have been so unhappy that I have had difficulty sleeping. 0    I have felt sad or miserable. 0    I have been so unhappy that I have been crying. 0    The thought of harming myself has occurred to me. 0    Edinburgh Postnatal Depression Scale Total 0             Health Maintenance Due  Topic Date Due   COVID-19 Vaccine (1) Never done    The following portions of the patient's history were reviewed and updated as appropriate: allergies, current medications, past family history, past medical history, past social history, past surgical history, and problem list.  Review of Systems Pertinent items noted in HPI and remainder of  comprehensive ROS otherwise negative.  Objective:  BP 107/67   Pulse (!) 56   Wt 166 lb (75.3 kg)   Breastfeeding Yes   BMI 29.41 kg/m    General:  alert and no distress   Breasts:  normal, done in presence of a chaperone  Lungs: clear to auscultation bilaterally  Heart:  regular rate and rhythm  Abdomen: soft, non-tender; bowel sounds normal; no masses,  no organomegaly   GU exam:  abnormal , thin, white malodorous discharge seen, testing sample obtained. Otherwise normal exam. Done in presence of RN as chaperone.        IUD Insertion Procedure Note Patient identified, informed consent performed, consent signed.   Discussed risks of irregular bleeding, cramping, infection, malpositioning or misplacement of the IUD outside the uterus which may require further procedure such as laparoscopy. Also discussed >99% contraception efficacy, increased risk of ectopic pregnancy with failure of method.   Emphasized that this did not protect against STIs, condoms recommended during all sexual encounters. Time out was performed.  Urine pregnancy test negative.  Speculum placed in the vagina.  Cervix visualized.  Cleaned with Betadine x 2.  Grasped anteriorly with a single tooth tenaculum.  Uterus sounded to 8 cm.  Mirena IUD placed per manufacturer's recommendations.  Strings trimmed to 3 cm. Tenaculum was removed, good  hemostasis noted.  Patient tolerated procedure well.   Patient was given post-procedure instructions.  She was advised to have backup contraception for one week.  Patient was also asked to check IUD strings periodically and follow up in 4 weeks for IUD check.  Assessment:   1. Encounter for insertion of mirena IUD 2. Postpartum care following vaginal delivery 3. Vaginal discharge  Plan:   Essential components of care per ACOG recommendations:  1.  Mood and well being: Patient with negative depression screening today. Reviewed local resources for support.  - Patient tobacco use?  No.   - hx of drug use? No.    2. Infant care and feeding:  -Patient currently breastmilk feeding? Yes. Reviewed importance of draining breast regularly to support lactation.  -Social determinants of health (SDOH) reviewed in EPIC. No concerns.  3. Sexuality, contraception and birth spacing - Patient does not want a pregnancy in the next year.  Desired family size is 1 children.  - Discussed birth spacing of 18 months - Reviewed reproductive life planning. Reviewed contraceptive methods based on pt preferences and effectiveness.  Patient desired IUD or IUS, Mirena placed today.  Can be in place for 8 years.  4. Sleep and fatigue -Encouraged family/partner/community support of 4 hrs of uninterrupted sleep to help with mood and fatigue  5. Physical Recovery  - Discussed patients delivery and complications. She describes her labor as good. - Patient had a Vaginal, no problems at delivery. Patient had a  right labial  laceration that was repaired. Perineal healing reviewed. Patient expressed understanding - Patient has urinary incontinence? No. - Patient is safe to resume physical and sexual activity  6.  Health Maintenance - HM due items addressed Yes - Last pap smear done at [facility] on 09/12/21, was normal.  Pap smear not done at today's visit.  -Breast Cancer screening indicated? No.   7. Abnormal vaginal discharge - Testing sample sent, will follow up results and manage accordingly.   Verita Schneiders, MD Center for [facility], [place]

## 2022-06-18 LAB — CERVICOVAGINAL ANCILLARY ONLY
Bacterial Vaginitis (gardnerella): POSITIVE — AB
Candida Glabrata: NEGATIVE
Candida Vaginitis: NEGATIVE
Comment: NEGATIVE
Comment: NEGATIVE
Comment: NEGATIVE
Comment: NEGATIVE
Trichomonas: NEGATIVE

## 2022-06-18 MED ORDER — METRONIDAZOLE 500 MG PO TABS: 500.0000 mg | ORAL_TABLET | Freq: Two times a day (BID) | ORAL | 0 refills | Status: AC

## 2022-06-18 NOTE — Addendum Note (Signed)
Addended by: Verita Schneiders A on: 06/18/2022 11:59 AM   Modules accepted: Orders

## 2022-06-23 DIAGNOSIS — Z419 Encounter for procedure for purposes other than remedying health state, unspecified: Secondary | ICD-10-CM | POA: Diagnosis not present

## 2022-07-16 ENCOUNTER — Ambulatory Visit: Payer: Medicaid Other | Admitting: Family Medicine

## 2022-07-17 ENCOUNTER — Ambulatory Visit: Payer: Medicaid Other | Admitting: Obstetrics & Gynecology

## 2022-07-21 ENCOUNTER — Telehealth: Payer: Self-pay | Admitting: Licensed Clinical Social Worker

## 2022-07-21 NOTE — Telephone Encounter (Signed)
TELEHEALTH VIRTUAL MOOD VISIT ENCOUNTER NOTE  I connected with Loretta Bird on 07/21/22 by telephone and verified that I am speaking with the correct person using two identifiers.   I discussed the limitations, risks, security and privacy concerns of performing an evaluation and management service by telephone and the availability of in person appointments. I also discussed with the patient that there may be a patient responsible charge related to this service. The patient expressed understanding and agreed to proceed.  LCSW introduced self and established psychological safety.  Patient verbally consented to this telephonic session.   Patient is located at home  LCSW located at remote work site   Time visit started: 3:56pm  Time visit ended: 4:18pm   SUBJECTIVE: Patient referred by Ellison Carwin, MSW Care Manager for postpartum mood check. Patient delivered on 05/07/22.  Patient complains of concerns that she may be experiencing postpartum depression. She reports that her mood is up and down since having a baby. She shares that she had a traumatic birth- long labor of 3 days which was very painful and tearing. She reports that she is currently weaning off of breast feeding and feels okay with this decision now. Patient reports some support from partner, but reports no other support and no family. She shares that she live's with a friend and the situation "could be better, but it's okay". She states that she has no support, and no family.  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 Patient reports low appetite, sleep disturbance - difficulties falling asleep, but reports sleeps better during the day, she states that her appetite is low, has some good days, denies suicidal ideation and homicidal ideation, denies intrusive thoughts, denies low energy, anhedonia, denies history of mental health diagnosis but does report she has benefited from therapy in the  past. Patient does report she is going on vacation for her up coming birthday and she is looking forward to that- she is going to [place] where she has family and the baby is staying with his father. Patient reports that she is interested in therapy and schedule a session.   ASSESSMENT: Patient currently experiencing symptoms of depressed mood, including depressed mood or feeling down more than half the days, anhedonia, sleep disturbance, appetite disturbance, denies suicidal ideation or homicidal ideation. Patient has limited support, she has a tolerable living situation and feels like her job is mostly okay. She is looking forward to celebrating her birthday in [place] and is open to therapy and scheduled an in-person appointment.   Patient may benefit from continued talk-therapy and further evaluation.  PRESENTING CONCERNS: Patient and/or family reports the following symptoms/concerns: Patient endorsees symptoms of postpartum depression and expresses some concern about her symptoms- feels like she would benefit from therapy and has in the past.  Duration of problem: 2.5 months; Severity of problem: moderate  STRENGTHS (Protective Factors/Coping Skills): Looking forward to upcoming birthday vacation. Some support from father of the baby. A tolerable living situation. Likes her job. Is open to seeking treatment and reports benefit from therapy in the past.   INTERVENTIONS: Interventions utilized:  Psychoeducation and/or Health Education Standardized Assessments completed:  LCSW was not able to access while on session, but assessed for postpartum mood and anxiety symptoms.   Conducted brief assessment  Provide brief psychoeducation on postpartum mood and anxiety disorders signs and symptoms, including information on Baby Blues, Postpartum Depression, and Postpartum Anxiety.   Discussed self-care strategies to prevent and/or  reduce symptoms of postpartum mood and anxiety disorders, including sleep  hygiene, eating regularly, drinking fluids, getting outside, and support from partner, family, and/or friends.   Informed patient to call her provider right away or get emergency help if she experiences any of the following symptoms: Feelings of hopelessness and total despair. Feeling out of touch with reality (hearing or seeing things other people don't). Feeling that you might hurt yourself or your baby.  PLAN: Patient is scheduled to see LCSW 08/06/2022.  Behavioral recommendations: patient is recommended to seek mental health therapy.  LCSW confirmed with patient that she has crisis line number [phone].   Kathreen Cosier

## 2022-07-21 NOTE — Telephone Encounter (Signed)
-----   Message from Ellison Carwin sent at 07/21/2022 11:36 AM EDT ----- Regarding: mh referral Hello again... at your convenience, would you please check in with this member?  She may be experiencing some pp depression. I will do a [facility] referral as well to ensure that she does not have any additional cm needs after I defer her on tomorrow.  Thanks,

## 2022-07-23 DIAGNOSIS — Z419 Encounter for procedure for purposes other than remedying health state, unspecified: Secondary | ICD-10-CM | POA: Diagnosis not present

## 2022-08-06 ENCOUNTER — Ambulatory Visit: Payer: Medicaid Other | Admitting: Licensed Clinical Social Worker
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
## 2022-08-12 IMAGING — CT CT HEAD W/O CM
3 of 4 series · 14 of 47 positions shown, 16 images · non-contrast
Comparison: None.

CLINICAL DATA: Syncope

EXAM:
CT HEAD WITHOUT CONTRAST
TECHNIQUE: Contiguous axial images were obtained from the base of the skull
through the vertex without intravenous contrast.

[Series 5: coronal soft tissue · coronal · 0.33mm/px · 3 of 66 slices shown]
[im 22/66  brain]
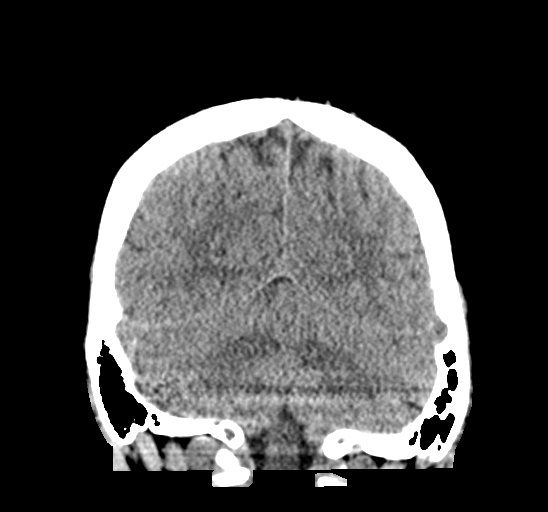
[im 29/66  brain]
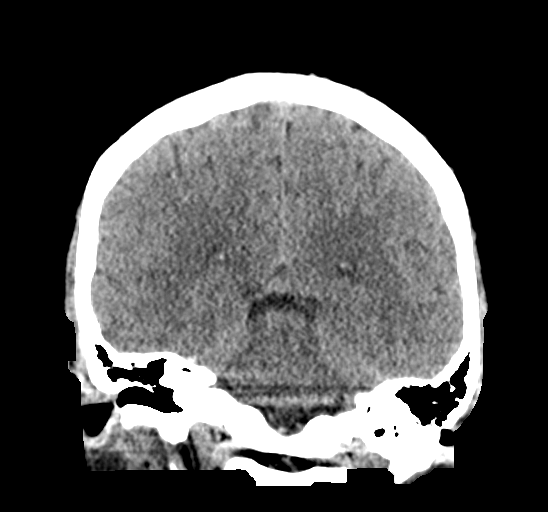
[im 37/66  brain]
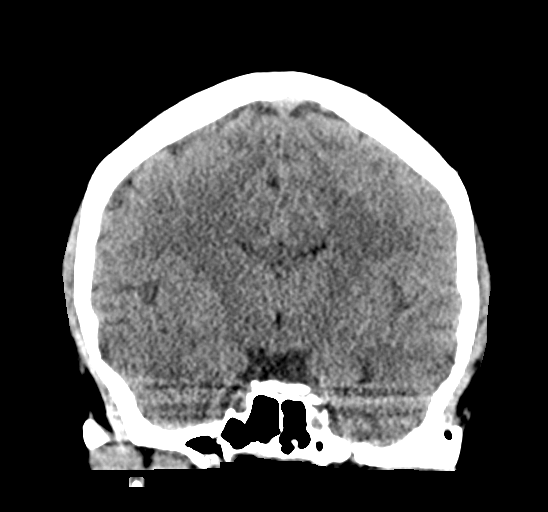

[Series 6: sagittal soft tissue · sagittal · 0.33mm/px · 3 of 62 slices shown]
[im 21/62  brain]
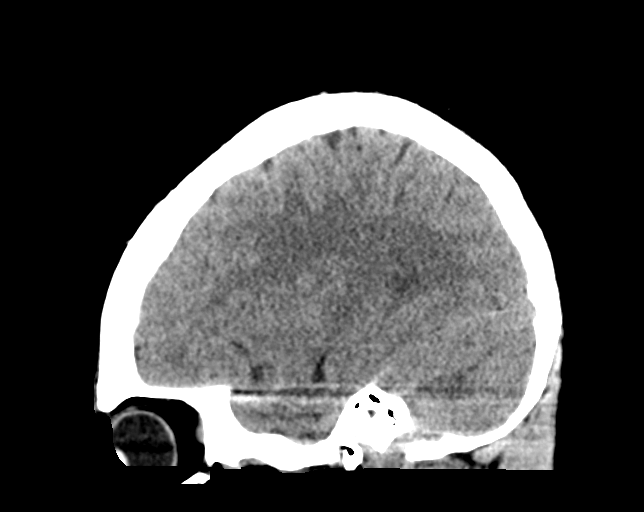
[im 31/62  brain]
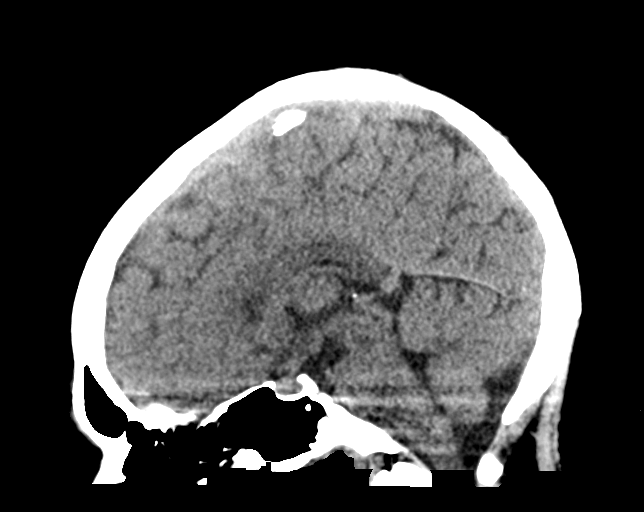
[im 41/62  brain]
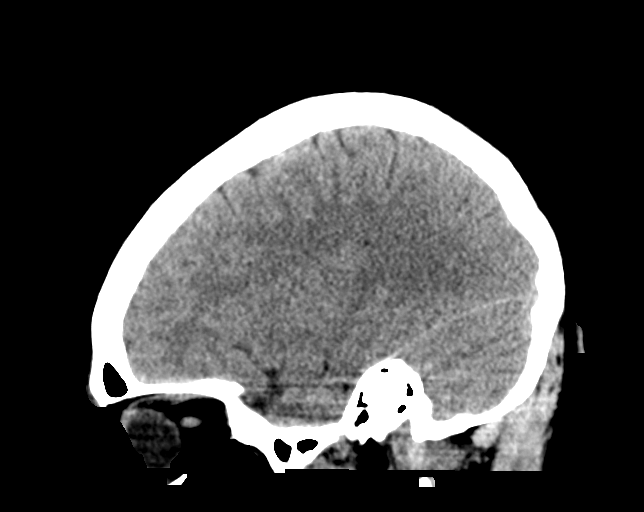

[Series 7: true axial · axial · 0.36mm/px · z∈[-134,-9]mm · 8 of 54 slices shown, 10 images]
[im 6/54  brain]
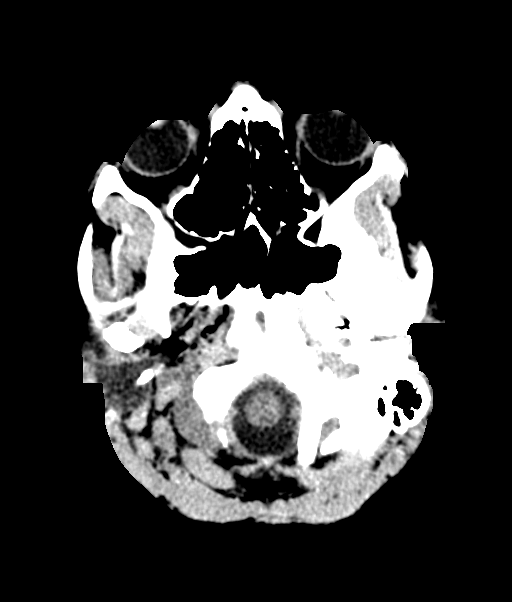
[im 6/54  bone]
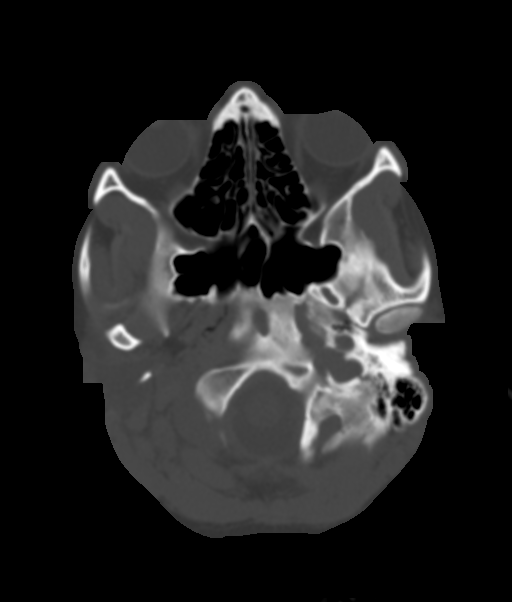
[im 12/54  brain]
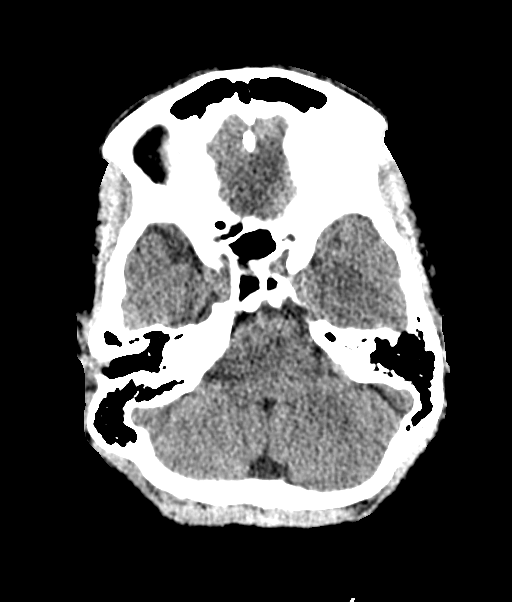
[im 18/54  brain]
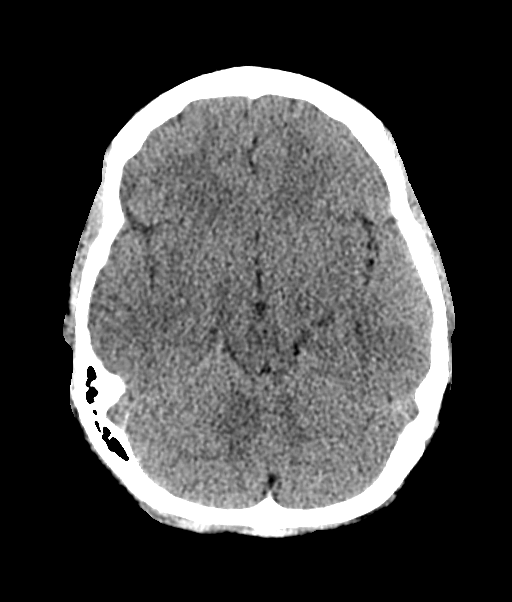
[im 24/54  brain]
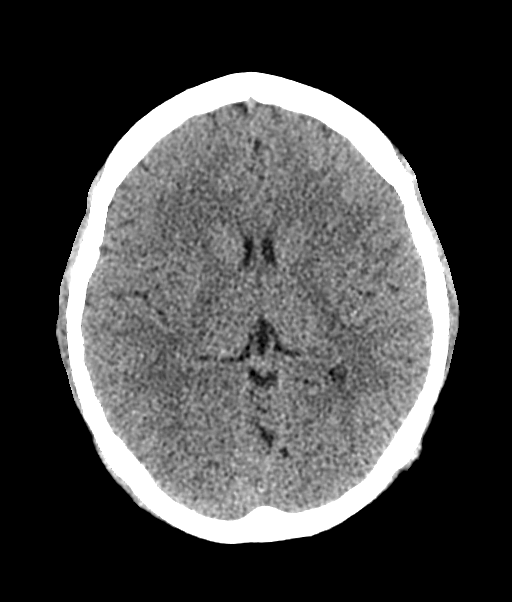
[im 30/54  brain]
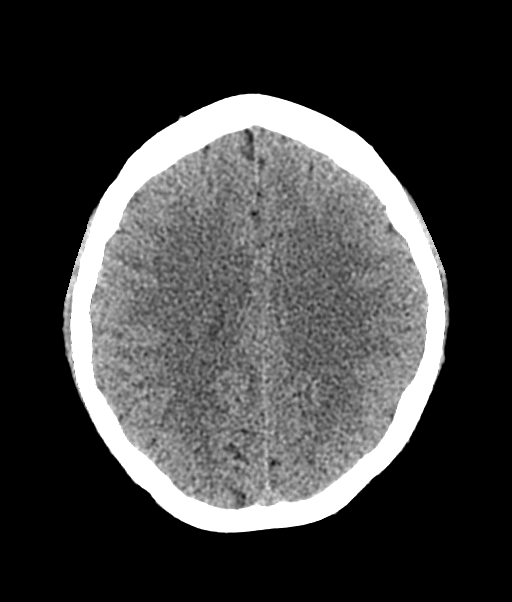
[im 30/54  bone]
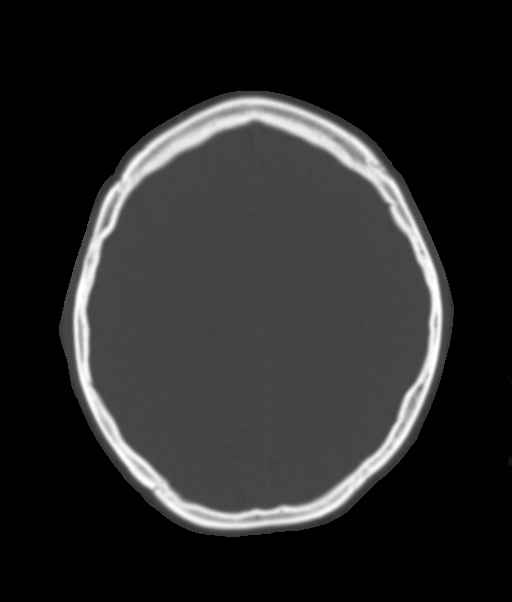
[im 36/54  brain]
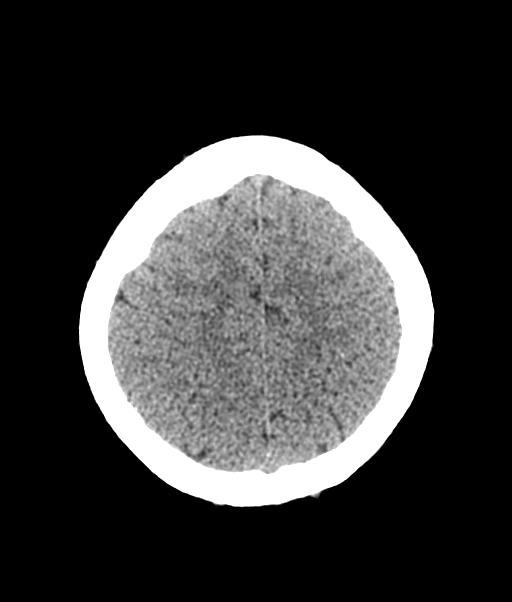
[im 42/54  brain]
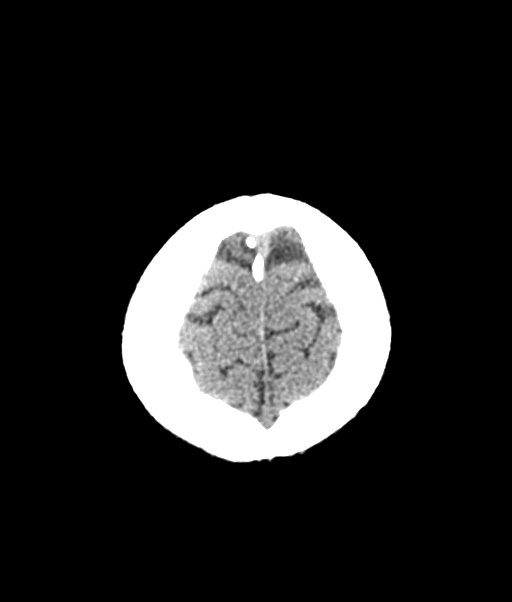
[im 48/54  brain]
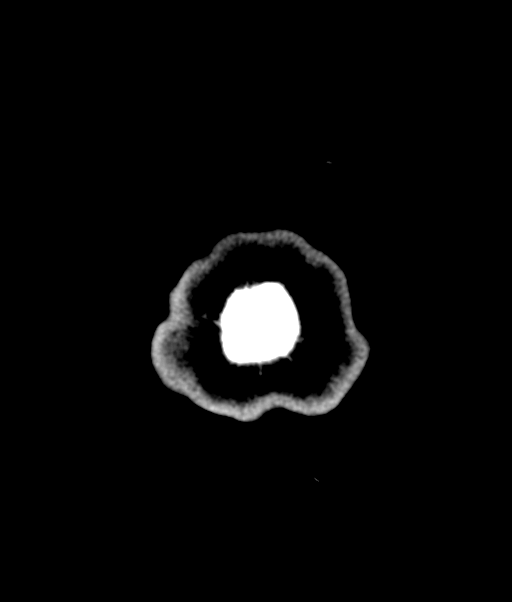

[14 of 47 positions shown; findings below may reference images not displayed]

FINDINGS: Brain: No evidence of acute infarction, hemorrhage, hydrocephalus,
extra-axial collection or mass lesion/mass effect.

Vascular: No hyperdense vessel or unexpected calcification.

Skull: Normal. Negative for fracture or focal lesion.

Sinuses/Orbits: The visualized paranasal sinuses are essentially
clear. The mastoid air cells are unopacified.

Other: None.
IMPRESSION: Normal head CT.

## 2022-08-23 DIAGNOSIS — Z419 Encounter for procedure for purposes other than remedying health state, unspecified: Secondary | ICD-10-CM | POA: Diagnosis not present

## 2022-09-22 DIAGNOSIS — Z419 Encounter for procedure for purposes other than remedying health state, unspecified: Secondary | ICD-10-CM | POA: Diagnosis not present

## 2022-10-23 DIAGNOSIS — Z419 Encounter for procedure for purposes other than remedying health state, unspecified: Secondary | ICD-10-CM | POA: Diagnosis not present

## 2022-11-23 DIAGNOSIS — Z419 Encounter for procedure for purposes other than remedying health state, unspecified: Secondary | ICD-10-CM | POA: Diagnosis not present

## 2022-12-23 DIAGNOSIS — Z419 Encounter for procedure for purposes other than remedying health state, unspecified: Secondary | ICD-10-CM | POA: Diagnosis not present

## 2023-01-23 DIAGNOSIS — Z419 Encounter for procedure for purposes other than remedying health state, unspecified: Secondary | ICD-10-CM | POA: Diagnosis not present

## 2023-02-04 ENCOUNTER — Encounter: Payer: Self-pay | Admitting: Family Medicine

## 2023-02-04 ENCOUNTER — Ambulatory Visit: Payer: Medicaid Other | Admitting: Family Medicine

## 2023-02-04 ENCOUNTER — Other Ambulatory Visit (HOSPITAL_COMMUNITY)
Admission: RE | Admit: 2023-02-04 | Discharge: 2023-02-04 | Disposition: A | Discharge status: Home / Self Care | Payer: Medicaid Other | Source: Ambulatory Visit | Attending: Family Medicine | Admitting: Family Medicine

## 2023-02-04 VITALS — BP 95/66 | HR 76 | Wt 152.0 lb

## 2023-02-04 DIAGNOSIS — N898 Other specified noninflammatory disorders of vagina: Secondary | ICD-10-CM

## 2023-02-04 DIAGNOSIS — Z30431 Encounter for routine checking of intrauterine contraceptive device: Secondary | ICD-10-CM

## 2023-02-04 DIAGNOSIS — N920 Excessive and frequent menstruation with regular cycle: Secondary | ICD-10-CM

## 2023-02-04 MED ORDER — METRONIDAZOLE 0.75 % VA GEL: 1.0000 | Freq: Every day | VAGINAL | 1 refills | Status: AC

## 2023-02-04 MED ORDER — DOXYCYCLINE HYCLATE 100 MG PO CAPS: 100.0000 mg | ORAL_CAPSULE | Freq: Two times a day (BID) | ORAL | 0 refills | Status: AC

## 2023-02-04 NOTE — Progress Notes (Signed)
Patient here for evaluation of IUD.   Inserted 06/17/22.  CC: vaginal discharge wants Rx for possible BV also, last week during intercourse had severe pain 10/10x  notes almost going to ER then pain decreased.  Pt cannot feel IUD strings. Worried IUD has moved or fell out.   LMP: 01/26/23 was very heavy.

## 2023-02-04 NOTE — Progress Notes (Signed)
    Subjective:    Patient ID: Loretta Bird is a 27 y.o. female presenting with Routine Prenatal Visit  on 02/04/2023  HPI: Has her Mirena in since 3/24. Had very heavy cycle last week and painful intercourse 2 weeks ago. Cannot feel strings.  Review of Systems  Constitutional:  Negative for chills and fever.  Respiratory:  Negative for shortness of breath.   Cardiovascular:  Negative for chest pain.  Gastrointestinal:  Negative for abdominal pain, nausea and vomiting.  Genitourinary:  Negative for dysuria.  Skin:  Negative for rash.      Objective:    BP 95/66   Pulse 76   Wt 152 lb (68.9 kg)   LMP 01/26/2023   BMI 26.93 kg/m  Physical Exam Exam conducted with a chaperone present.  Constitutional:      General: She is not in acute distress.    Appearance: She is well-developed.  HENT:     Head: Normocephalic and atraumatic.  Eyes:     General: No scleral icterus. Cardiovascular:     Rate and Rhythm: Normal rate.  Pulmonary:     Effort: Pulmonary effort is normal.  Abdominal:     Palpations: Abdomen is soft.  Genitourinary:    General: Normal vulva.     Vagina: Vaginal discharge present.     Cervix: Normal. No cervical motion tenderness.     Comments: IUD strings noted Musculoskeletal:     Cervical back: Neck supple.  Skin:    General: Skin is warm and dry.  Neurological:     Mental Status: She is alert and oriented to person, place, and time.         Assessment & Plan:   Problem List Items Addressed This Visit       Unprioritized   Menorrhagia with regular cycle - Primary    ? Related to endometritis. Trial of doxy--IUD appears in place--consider u/s if no resolution.      Relevant Medications   doxycycline (VIBRAMYCIN) 100 MG capsule   Other Visit Diagnoses     Vaginal discharge       treat presumptively for BV--check cultures - adjust if needed.   Relevant Medications   metroNIDAZOLE (METROGEL) 0.75 % vaginal gel   Other Relevant  Orders   Cervicovaginal ancillary only( [place])   IUD check up       IUD in place        Return if symptoms worsen or fail to improve.  Reva Bores, MD 02/04/2023 2:38 PM

## 2023-02-04 NOTE — Assessment & Plan Note (Signed)
?   Related to endometritis. Trial of doxy--IUD appears in place--consider u/s if no resolution.

## 2023-02-05 LAB — CERVICOVAGINAL ANCILLARY ONLY
Bacterial Vaginitis (gardnerella): POSITIVE — AB
Candida Glabrata: NEGATIVE
Candida Vaginitis: NEGATIVE
Comment: NEGATIVE
Comment: NEGATIVE
Comment: NEGATIVE

## 2023-02-22 DIAGNOSIS — Z419 Encounter for procedure for purposes other than remedying health state, unspecified: Secondary | ICD-10-CM | POA: Diagnosis not present

## 2023-03-25 DIAGNOSIS — Z419 Encounter for procedure for purposes other than remedying health state, unspecified: Secondary | ICD-10-CM | POA: Diagnosis not present

## 2023-04-25 DIAGNOSIS — Z419 Encounter for procedure for purposes other than remedying health state, unspecified: Secondary | ICD-10-CM | POA: Diagnosis not present

## 2023-05-23 DIAGNOSIS — Z419 Encounter for procedure for purposes other than remedying health state, unspecified: Secondary | ICD-10-CM | POA: Diagnosis not present

## 2023-06-05 DIAGNOSIS — Z793 Long term (current) use of hormonal contraceptives: Secondary | ICD-10-CM | POA: Diagnosis not present

## 2023-06-05 DIAGNOSIS — R030 Elevated blood-pressure reading, without diagnosis of hypertension: Secondary | ICD-10-CM | POA: Diagnosis not present

## 2023-06-05 DIAGNOSIS — F320 Major depressive disorder, single episode, mild: Secondary | ICD-10-CM | POA: Diagnosis not present

## 2023-06-05 DIAGNOSIS — R011 Cardiac murmur, unspecified: Secondary | ICD-10-CM | POA: Diagnosis not present

## 2023-06-05 DIAGNOSIS — L309 Dermatitis, unspecified: Secondary | ICD-10-CM | POA: Diagnosis not present

## 2023-07-04 DIAGNOSIS — Z419 Encounter for procedure for purposes other than remedying health state, unspecified: Secondary | ICD-10-CM | POA: Diagnosis not present

## 2023-08-03 DIAGNOSIS — Z419 Encounter for procedure for purposes other than remedying health state, unspecified: Secondary | ICD-10-CM | POA: Diagnosis not present

## 2023-09-03 DIAGNOSIS — Z419 Encounter for procedure for purposes other than remedying health state, unspecified: Secondary | ICD-10-CM | POA: Diagnosis not present

## 2023-10-03 DIAGNOSIS — Z419 Encounter for procedure for purposes other than remedying health state, unspecified: Secondary | ICD-10-CM | POA: Diagnosis not present

## 2023-10-08 ENCOUNTER — Encounter: Payer: Self-pay | Admitting: Family Medicine

## 2023-10-08 ENCOUNTER — Ambulatory Visit: Admitting: Family Medicine

## 2023-10-08 DIAGNOSIS — Z113 Encounter for screening for infections with a predominantly sexual mode of transmission: Secondary | ICD-10-CM

## 2023-10-08 LAB — WET PREP FOR TRICH, YEAST, CLUE
Clue Cell Exam: NEGATIVE
Trichomonas Exam: NEGATIVE
Yeast Exam: NEGATIVE

## 2023-10-08 LAB — HM HIV SCREENING LAB: HM HIV Screening: NEGATIVE
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
## 2023-10-08 NOTE — Progress Notes (Signed)
 Patient here for STD screening. Wet prep results reviewed; no treatment required per standing orders. Condoms declined. All questions answered and verbalizes understanding.  Doyce CINDERELLA Shuck, RN

## 2023-10-14 LAB — GONOCOCCUS CULTURE

## 2023-10-15 ENCOUNTER — Ambulatory Visit: Admitting: Obstetrics & Gynecology

## 2023-10-19 ENCOUNTER — Encounter: Payer: Self-pay | Admitting: Family Medicine

## 2023-11-03 DIAGNOSIS — Z419 Encounter for procedure for purposes other than remedying health state, unspecified: Secondary | ICD-10-CM | POA: Diagnosis not present

## 2023-12-04 DIAGNOSIS — Z419 Encounter for procedure for purposes other than remedying health state, unspecified: Secondary | ICD-10-CM | POA: Diagnosis not present
